# Patient Record
Sex: Male | Born: 1975
Health system: Southern US, Community
[De-identification: ages and names within clinical notes are randomized; demographics above are authoritative.]

## PROBLEM LIST (undated history)

## (undated) DIAGNOSIS — F17200 Nicotine dependence, unspecified, uncomplicated: Secondary | ICD-10-CM

## (undated) DIAGNOSIS — T7840XA Allergy, unspecified, initial encounter: Secondary | ICD-10-CM

## (undated) DIAGNOSIS — E119 Type 2 diabetes mellitus without complications: Secondary | ICD-10-CM

## (undated) DIAGNOSIS — J45909 Unspecified asthma, uncomplicated: Secondary | ICD-10-CM

## (undated) DIAGNOSIS — I509 Heart failure, unspecified: Secondary | ICD-10-CM

## (undated) DIAGNOSIS — IMO0001 Reserved for inherently not codable concepts without codable children: Secondary | ICD-10-CM

## (undated) DIAGNOSIS — E785 Hyperlipidemia, unspecified: Secondary | ICD-10-CM

## (undated) DIAGNOSIS — Z794 Long term (current) use of insulin: Secondary | ICD-10-CM

## (undated) DIAGNOSIS — F319 Bipolar disorder, unspecified: Secondary | ICD-10-CM

## (undated) HISTORY — DX: Bipolar disorder, unspecified: F31.9

## (undated) HISTORY — DX: Long term (current) use of insulin: Z79.4

## (undated) HISTORY — DX: Type 2 diabetes mellitus without complications: E11.9

## (undated) HISTORY — DX: Hyperlipidemia, unspecified: E78.5

## (undated) HISTORY — DX: Unspecified asthma, uncomplicated: J45.909

## (undated) HISTORY — DX: Heart failure, unspecified: I50.9

## (undated) HISTORY — DX: Reserved for inherently not codable concepts without codable children: IMO0001

## (undated) HISTORY — DX: Allergy, unspecified, initial encounter: T78.40XA

## (undated) HISTORY — DX: Nicotine dependence, unspecified, uncomplicated: F17.200

---

## 2008-05-09 ENCOUNTER — Ambulatory Visit: Payer: Self-pay | Admitting: Internal Medicine

## 2008-12-17 ENCOUNTER — Emergency Department (HOSPITAL_COMMUNITY): Admission: EM | Admit: 2008-12-17 | Discharge: 2008-12-17 | Payer: Self-pay | Admitting: Family Medicine

## 2009-01-24 ENCOUNTER — Ambulatory Visit: Payer: Self-pay | Admitting: Internal Medicine

## 2009-01-24 ENCOUNTER — Encounter (INDEPENDENT_AMBULATORY_CARE_PROVIDER_SITE_OTHER): Payer: Self-pay | Admitting: Adult Health

## 2009-01-25 ENCOUNTER — Ambulatory Visit: Payer: Self-pay | Admitting: *Deleted

## 2009-01-31 ENCOUNTER — Ambulatory Visit: Payer: Self-pay | Admitting: Cardiology

## 2009-01-31 ENCOUNTER — Encounter: Payer: Self-pay | Admitting: Internal Medicine

## 2009-01-31 ENCOUNTER — Ambulatory Visit (HOSPITAL_COMMUNITY): Admission: RE | Admit: 2009-01-31 | Discharge: 2009-01-31 | Payer: Self-pay | Admitting: Internal Medicine

## 2009-06-05 ENCOUNTER — Ambulatory Visit: Payer: Self-pay | Admitting: Internal Medicine

## 2009-09-05 ENCOUNTER — Ambulatory Visit: Payer: Self-pay | Admitting: Internal Medicine

## 2009-09-25 ENCOUNTER — Ambulatory Visit: Payer: Self-pay | Admitting: Family Medicine

## 2009-09-25 ENCOUNTER — Encounter (INDEPENDENT_AMBULATORY_CARE_PROVIDER_SITE_OTHER): Payer: Self-pay | Admitting: Adult Health

## 2009-09-25 LAB — CONVERTED CEMR LAB
ALT: 22 units/L (ref 0–53)
AST: 21 units/L (ref 0–37)
BUN: 10 mg/dL (ref 6–23)
Basophils Absolute: 0 10*3/uL (ref 0.0–0.1)
Basophils Relative: 0 % (ref 0–1)
Cholesterol: 186 mg/dL (ref 0–200)
Creatinine, Ser: 0.78 mg/dL (ref 0.40–1.50)
Eosinophils Absolute: 0.2 10*3/uL (ref 0.0–0.7)
Eosinophils Relative: 2 % (ref 0–5)
HDL: 50 mg/dL (ref >39)
MCHC: 31.9 g/dL (ref 30.0–36.0)
MCV: 97.3 fL (ref 78.0–100.0)
Neutrophils Relative %: 61 % (ref 43–77)
Platelets: 351 10*3/uL (ref 150–400)
RDW: 11.8 % (ref 11.5–15.5)
Total Bilirubin: 0.7 mg/dL (ref 0.3–1.2)
Total CHOL/HDL Ratio: 3.7
VLDL: 19 mg/dL (ref 0–40)
WBC: 9.9 10*3/uL (ref 4.0–10.5)

## 2009-11-28 ENCOUNTER — Ambulatory Visit: Payer: Self-pay | Admitting: Internal Medicine

## 2010-01-17 ENCOUNTER — Ambulatory Visit: Payer: Self-pay | Admitting: Internal Medicine

## 2010-02-04 ENCOUNTER — Ambulatory Visit: Payer: Self-pay | Admitting: Internal Medicine

## 2010-02-04 ENCOUNTER — Encounter (INDEPENDENT_AMBULATORY_CARE_PROVIDER_SITE_OTHER): Payer: Self-pay | Admitting: Adult Health

## 2010-02-04 LAB — CONVERTED CEMR LAB
ALT: 19 units/L (ref 0–53)
AST: 24 units/L (ref 0–37)
Albumin: 4.8 g/dL (ref 3.5–5.2)
BUN: 12 mg/dL (ref 6–23)
Calcium: 9.9 mg/dL (ref 8.4–10.5)
Chloride: 101 meq/L (ref 96–112)
HDL: 50 mg/dL (ref >39)
Potassium: 4.8 meq/L (ref 3.5–5.3)
Sodium: 138 meq/L (ref 135–145)

## 2011-03-31 LAB — GLUCOSE, CAPILLARY
Glucose-Capillary: 343 mg/dL — ABNORMAL HIGH (ref 70–99)
Glucose-Capillary: 398 mg/dL — ABNORMAL HIGH (ref 70–99)

## 2015-12-16 DIAGNOSIS — I509 Heart failure, unspecified: Secondary | ICD-10-CM

## 2015-12-16 HISTORY — DX: Heart failure, unspecified: I50.9

## 2015-12-16 HISTORY — PX: COLONOSCOPY: SHX174

## 2018-02-12 HISTORY — PX: NO PAST SURGERIES: SHX2092

## 2018-02-18 DIAGNOSIS — Z760 Encounter for issue of repeat prescription: Secondary | ICD-10-CM | POA: Diagnosis not present

## 2018-02-18 DIAGNOSIS — E109 Type 1 diabetes mellitus without complications: Secondary | ICD-10-CM | POA: Diagnosis not present

## 2018-02-25 ENCOUNTER — Ambulatory Visit: Payer: Self-pay | Admitting: Medical

## 2018-02-26 ENCOUNTER — Ambulatory Visit (INDEPENDENT_AMBULATORY_CARE_PROVIDER_SITE_OTHER): Payer: 59 | Admitting: Medical

## 2018-02-26 ENCOUNTER — Encounter: Payer: Self-pay | Admitting: Medical

## 2018-02-26 VITALS — BP 122/74 | HR 83 | Ht 70.0 in | Wt 164.8 lb

## 2018-02-26 DIAGNOSIS — E785 Hyperlipidemia, unspecified: Secondary | ICD-10-CM

## 2018-02-26 DIAGNOSIS — F17200 Nicotine dependence, unspecified, uncomplicated: Secondary | ICD-10-CM

## 2018-02-26 DIAGNOSIS — E1065 Type 1 diabetes mellitus with hyperglycemia: Secondary | ICD-10-CM | POA: Diagnosis not present

## 2018-02-26 DIAGNOSIS — J4540 Moderate persistent asthma, uncomplicated: Secondary | ICD-10-CM | POA: Insufficient documentation

## 2018-02-26 DIAGNOSIS — J301 Allergic rhinitis due to pollen: Secondary | ICD-10-CM | POA: Diagnosis not present

## 2018-02-26 DIAGNOSIS — F319 Bipolar disorder, unspecified: Secondary | ICD-10-CM

## 2018-02-26 LAB — POCT GLYCOSYLATED HEMOGLOBIN (HGB A1C): Hemoglobin A1C: 6

## 2018-02-26 MED ORDER — ALBUTEROL SULFATE HFA 108 (90 BASE) MCG/ACT IN AERS: 2.0000 | INHALATION_SPRAY | Freq: Four times a day (QID) | RESPIRATORY_TRACT | 1 refills | Status: DC | PRN

## 2018-02-26 MED ORDER — QUETIAPINE FUMARATE 50 MG PO TABS: 50.0000 mg | ORAL_TABLET | Freq: Every day | ORAL | 1 refills | Status: AC

## 2018-02-26 MED ORDER — INSULIN ASPART PROT & ASPART (70-30 MIX) 100 UNIT/ML PEN: PEN_INJECTOR | SUBCUTANEOUS | 2 refills | Status: DC

## 2018-02-26 MED ORDER — FLUTICASONE PROPIONATE 50 MCG/ACT NA SUSP: NASAL | 11 refills | Status: AC

## 2018-02-26 MED ORDER — QUETIAPINE FUMARATE ER 200 MG PO TB24: 200.0000 mg | ORAL_TABLET | Freq: Every day | ORAL | 1 refills | Status: AC

## 2018-02-26 MED ORDER — ROSUVASTATIN CALCIUM 5 MG PO TABS: 5.0000 mg | ORAL_TABLET | Freq: Every day | ORAL | 1 refills | Status: DC

## 2018-02-26 MED ORDER — LISINOPRIL 5 MG PO TABS: 5.0000 mg | ORAL_TABLET | Freq: Every day | ORAL | 3 refills | Status: DC

## 2018-02-26 NOTE — Progress Notes (Signed)
Subjective: Chief Complaint  Patient presents with  . New Patient (Initial Visit)    new pt , dm , new refills    Here as a new patient.  Fasting today for labs.   Referred by Rubbie BattiestKeavie Chapman.  Works at [employer].  Type 1 Diabetes - taking Novolog 70/30, 55u in morning, 45u night.  Checking glucose regularly and seeing high numbers.   Been on insulin since age 42yo.  Interested in a pump.   Hyperlipidemia - has had stomach bloating with Lovastatin, but tolerates Crestor 5mg .   Hasn't been on cholsteerol medication since December due to changes in job, finances, and rationing out medication.     Takes Lisinopril 5mg  daily to protect the kidney.  No hx/o HTN.  Takes Seroquel XR 200mg  daily at bedtime, and Seroquel 50mg  daily in morning, but been out of this for a month.    Has hx/o Bipolar.  Was getting this through his prior PCP.  [place] seen psychiatrist about a year ago in [place], and prior [place] psychiatrist in [place], [place].  Asthma and allergies - uses flonase, takes Albuterol 2 puffs QHS.  Smoker.    Has hx/o CHF 2017.   Doing fine since.  Not currently seeing cardiology.   Past Medical History:  Diagnosis Date  . Allergy   . Asthma   . Bipolar depression (HCC)   . CHF (congestive heart failure) (HCC) 2017  . Hyperlipidemia   . IDDM (insulin dependent diabetes mellitus) (HCC)    since age 42yo  . Smoker     No current outpatient medications on file prior to visit.   No current facility-administered medications on file prior to visit.     ROS as in subjective   Objective: BP 122/74   Pulse 83   Ht 5\' 10"  (1.778 m)   Wt 164 lb 12.8 oz (74.8 kg)   SpO2 95%   BMI 23.65 kg/m   General appearance: alert, no distress, WD/WN, lean AA male Neck: supple, no lymphadenopathy, no thyromegaly, no masses, no bruits Heart: RRR, normal S1, S2, no murmurs Lungs: CTA bilaterally, no wheezes, rhonchi, or rales Pulses: 2+ symmetric, upper and lower extremities, normal cap  refill Ext: no edema  Diabetic Foot Exam - Simple   Simple Foot Form Diabetic Foot exam was performed with the following findings:  Yes 02/26/2018  9:28 AM  Visual Inspection See comments:  Yes Sensation Testing Intact to touch and monofilament testing bilaterally:  Yes Pulse Check Posterior Tibialis and Dorsalis pulse intact bilaterally:  Yes Comments Right 5th toe small callous lateral DIP      Assessment: Encounter Diagnoses  Name Primary?  Marland Kitchen. Uncontrolled type 1 diabetes mellitus with hyperglycemia (HCC) Yes  . Hyperlipidemia, unspecified hyperlipidemia type   . Smoker   . Moderate persistent asthma, unspecified whether complicated   . Allergic rhinitis due to pollen, unspecified seasonality   . Bipolar affective disorder, remission status unspecified (HCC)      Plan: We discussed his medical history, his medications, and it sounds like he is usually compliant and good about monitoring his sugars and health.  He has been out of medicine lately and rationing his medicine.  His hemoglobin A1c was 6.0% today however his home readings had several postprandial readings 400-600s.  Thus I am not so sure our numbers are trustworthy today.  Given his interest in the insulin pump we will refer to endocrinology  I refilled all of his medications today.  We discussed  routine diabetic care including daily foot checks, yearly eye doctor visit, regular follow-up here.  Recommend he quit smoking  Next visit consider preventative inhaler, update PFTs  Next visit consider EKG.  Nathan Mccullough was seen today for new patient (initial visit).  Diagnoses and all orders for this visit:  Uncontrolled type 1 diabetes mellitus with hyperglycemia (HCC) -     HgB A1c -     CBC with Differential/Platelet -     TSH -     HM DIABETES EYE EXAM -     HM DIABETES FOOT EXAM -     Microalbumin / creatinine urine ratio -     Ambulatory referral to Endocrinology  Hyperlipidemia, unspecified hyperlipidemia  type -     Comprehensive metabolic panel -     CBC with Differential/Platelet -     Lipid panel  Smoker  Moderate persistent asthma, unspecified whether complicated -     CBC with Differential/Platelet  Allergic rhinitis due to pollen, unspecified seasonality  Bipolar affective disorder, remission status unspecified (HCC)  Other orders -     albuterol (PROVENTIL HFA;VENTOLIN HFA) 108 (90 Base) MCG/ACT inhaler; Inhale 2 puffs into the lungs every 6 (six) hours as needed for wheezing or shortness of breath. -     fluticasone (FLONASE) 50 MCG/ACT nasal spray; 1 spray by Both Nostrils route daily. -     lisinopril (PRINIVIL,ZESTRIL) 5 MG tablet; Take 1 tablet (5 mg total) by mouth daily. -     QUEtiapine (SEROQUEL XR) 200 MG 24 hr tablet; Take 1 tablet (200 mg total) by mouth at bedtime. -     QUEtiapine (SEROQUEL) 50 MG tablet; Take 1 tablet (50 mg total) by mouth daily. -     rosuvastatin (CRESTOR) 5 MG tablet; Take 1 tablet (5 mg total) by mouth at bedtime. -     insulin aspart protamine - aspart (NOVOLOG 70/30 MIX) (70-30) 100 UNIT/ML FlexPen; Inject 55 units into skin every morning and 45 units every evening

## 2018-02-27 LAB — CBC WITH DIFFERENTIAL/PLATELET
BASOS ABS: 0 10*3/uL (ref 0.0–0.2)
Basos: 0 %
EOS (ABSOLUTE): 0.3 10*3/uL (ref 0.0–0.4)
Eos: 2 %
Hematocrit: 44.3 % (ref 37.5–51.0)
Hemoglobin: 14.6 g/dL (ref 13.0–17.7)
IMMATURE GRANS (ABS): 0 10*3/uL (ref 0.0–0.1)
IMMATURE GRANULOCYTES: 0 %
LYMPHS: 19 %
Lymphocytes Absolute: 2.7 10*3/uL (ref 0.7–3.1)
MCH: 31.9 pg (ref 26.6–33.0)
MCHC: 33 g/dL (ref 31.5–35.7)
MCV: 97 fL (ref 79–97)
MONOCYTES: 5 %
Monocytes Absolute: 0.7 10*3/uL (ref 0.1–0.9)
Neutrophils Absolute: 10.5 10*3/uL — ABNORMAL HIGH (ref 1.4–7.0)
Neutrophils: 74 %
PLATELETS: 390 10*3/uL — AB (ref 150–379)
RBC: 4.58 x10E6/uL (ref 4.14–5.80)
RDW: 12.1 % — ABNORMAL LOW (ref 12.3–15.4)
WBC: 14.2 10*3/uL — ABNORMAL HIGH (ref 3.4–10.8)

## 2018-02-27 LAB — COMPREHENSIVE METABOLIC PANEL
A/G RATIO: 1.6 (ref 1.2–2.2)
ALT: 19 IU/L (ref 0–44)
AST: 19 IU/L (ref 0–40)
Albumin: 4.5 g/dL (ref 3.5–5.5)
Alkaline Phosphatase: 86 IU/L (ref 39–117)
BILIRUBIN TOTAL: 0.7 mg/dL (ref 0.0–1.2)
BUN/Creatinine Ratio: 16 (ref 9–20)
BUN: 16 mg/dL (ref 6–24)
CALCIUM: 9.5 mg/dL (ref 8.7–10.2)
CHLORIDE: 100 mmol/L (ref 96–106)
CO2: 19 mmol/L — ABNORMAL LOW (ref 20–29)
Creatinine, Ser: 1.02 mg/dL (ref 0.76–1.27)
GFR calc Af Amer: 106 mL/min/{1.73_m2} (ref >59)
GFR, EST NON AFRICAN AMERICAN: 92 mL/min/{1.73_m2} (ref >59)
GLUCOSE: 328 mg/dL — AB (ref 65–99)
Globulin, Total: 2.9 g/dL (ref 1.5–4.5)
POTASSIUM: 4.6 mmol/L (ref 3.5–5.2)
Sodium: 136 mmol/L (ref 134–144)
Total Protein: 7.4 g/dL (ref 6.0–8.5)

## 2018-02-27 LAB — LIPID PANEL
CHOLESTEROL TOTAL: 186 mg/dL (ref 100–199)
Chol/HDL Ratio: 4 ratio (ref 0.0–5.0)
HDL: 46 mg/dL (ref >39)
LDL Calculated: 106 mg/dL — ABNORMAL HIGH (ref 0–99)
Triglycerides: 171 mg/dL — ABNORMAL HIGH (ref 0–149)
VLDL CHOLESTEROL CAL: 34 mg/dL (ref 5–40)

## 2018-02-27 LAB — MICROALBUMIN / CREATININE URINE RATIO
CREATININE, UR: 112.9 mg/dL
MICROALBUM., U, RANDOM: 6.3 ug/mL
Microalb/Creat Ratio: 5.6 mg/g creat (ref 0.0–30.0)

## 2018-02-27 LAB — TSH: TSH: 1.43 u[IU]/mL (ref 0.450–4.500)

## 2018-03-01 ENCOUNTER — Telehealth: Payer: Self-pay

## 2018-03-01 NOTE — Telephone Encounter (Signed)
Vernona RiegerLaura, can you write letter on behalf of him for insurance coverage for 70/30 insulin.  He notes prior problems with Novolog or Humolog R .  He apparently has only tolerated 70/30 insulin.   He notes insurance is denying this.   He is a type 1 diabetic.   Thanks, Vincenza HewsShane

## 2018-03-01 NOTE — Telephone Encounter (Signed)
Patient called and left voicemail on nurse line, stating he would like to know if referral to endocrinology was sent in. I advised patient that it was sent in.  Patient also advised me that his insulin 70/30 was not covered by his insurance. He stated that this is the only insulin that works for him as he has allergy to all other types. Patient states that if we send a letter to them advising that he needs this type of insulin they will cover. He will be out of insulin 03/14/18 and can not afford the cost and will then be out of insulin.  Please advise if doing this letter would be okay?   Thank you!

## 2018-03-02 NOTE — Telephone Encounter (Signed)
Called patient, given lab results. Patient made appointment for follow up fasting in 3 months. Referral for endocrinology was sent in.

## 2018-03-02 NOTE — Telephone Encounter (Signed)
-----   Message from Jac Canavanavid S Tysinger, PA-C sent at 03/01/2018  9:36 PM EDT ----- Glucose elevated, rest of labs ok.   Make sure we referred to endocrinology.  C/t same medication and follow up with fasting recheck in 3 months

## 2018-03-02 NOTE — Progress Notes (Signed)
Called patient and given lab results, made appointment in 3 months for fasting recheck. No questions or concerns.

## 2018-03-02 NOTE — Progress Notes (Signed)
Called and notified patient of results. Patient understood and made follow up appointment. No questions or concerns.

## 2018-03-04 ENCOUNTER — Telehealth: Payer: Self-pay | Admitting: Medical

## 2018-03-04 NOTE — Telephone Encounter (Signed)
Called pharmacy & Novolog went thru but cost is $1061 for 30 days, no P.A. needed and that is with discount card which took off $200. I called pt for drug history and he states Humulin R increased his blood sugar & Humalog he couldn't take because he was allergic to it. I called [facility] t#  [phone] [id number] no P.A. Needed, it is covered just a higher Tier, so I completed Tiering exception over the phone, P.A. #[id number]

## 2018-03-08 NOTE — Telephone Encounter (Signed)
Recv'd fax that tiering exception request cancelled.  I called [facility] t# [phone] was informed because this is plan exclusion can't do a tiering exception.  Spoke with member services and preferred medication is Humalog and pt is allergic to Humalog so only option is to file P.A. For medical necessity and if denied can do an appeal then may can get a Tier 3 structure approval.  Said to do an Urgent request.  Then if can get approved can then do a Tier/co pay reduction request.  Unable to complete P.A. Thru cover my meds because kept getting rejection stating medication doesn't require P.A. So I completed P.A. Over the phone P.A. # [phone], will receive response within 24 hours.  Tried to call pt unable to leave message

## 2018-03-09 ENCOUNTER — Telehealth: Payer: Self-pay | Admitting: Medical

## 2018-03-09 ENCOUNTER — Encounter: Payer: Self-pay | Admitting: Medical

## 2018-03-09 NOTE — Telephone Encounter (Signed)
Recv'd fax back from [facility] stating Novolog on plans covered list of meds and no P.A. Is required.  Called [facility] T# [phone] and was told today that it is covered Tier 6 non formulary and didn't know why I had been told that was plan exclusion so I can't do a P.A. And get a denial for an appeal.  So said no other alternatives to help pt.  She didn't know why it was so expensive and only suggestion she had was to call member services to see if pt has a high deductible.  I called member services t# [phone] and they said Novolog is a plan exclusion, I explained this is ridiculous that I have been told multiple times wrong information and she apologized.  She gave me information to appeals department fax# [phone] ATTN: Appeal coordinator

## 2018-03-09 NOTE — Telephone Encounter (Signed)
[employer] Pt Assistance T#[phone] & they are not able to help pt because he is insured, they only assist with uninsured and Medicare and they do not have emergency or temporary programs available.

## 2018-03-09 NOTE — Telephone Encounter (Signed)
Appeal letter typed and faxed  

## 2018-03-10 ENCOUNTER — Other Ambulatory Visit: Payer: Self-pay

## 2018-03-10 ENCOUNTER — Telehealth: Payer: Self-pay | Admitting: Medical

## 2018-03-10 DIAGNOSIS — E1065 Type 1 diabetes mellitus with hyperglycemia: Secondary | ICD-10-CM

## 2018-03-10 MED ORDER — INSULIN ISOPHANE & REGULAR (HUMAN 70-30)100 UNIT/ML KWIKPEN: PEN_INJECTOR | SUBCUTANEOUS | 2 refills | Status: DC

## 2018-03-10 NOTE — Telephone Encounter (Signed)
Sent over referral information.

## 2018-03-10 NOTE — Telephone Encounter (Signed)
Discussed with Vincenza HewsShane and will change to Relion [facility] brand 70/30 same dosage.  Called [facility] pharmacist at t# [phone] and they do have Relion flexpens 5 pens in box for $42 same dosage as Novolog and for 100ml qd will need 2 boxes for 30 days so will cost pt $82 if insurance will not pay anything.

## 2018-03-10 NOTE — Telephone Encounter (Signed)
Rep Nathan DikeJennifer said Dr. Turner Mccullough'Nathan Mccullough Endocrinologist at [facility] in [place] is accepting new patients and should be able to get pt in quicker T#[phone].  Called Nathan Mccullough new patient coordinator and she request referral with note stating urgent and she would try to get pt in as soon as possible.   Nathan FanningJulie per Nathan HewsShane please send in referral with note requesting urgent appointment. Thanks

## 2018-03-15 ENCOUNTER — Ambulatory Visit: Payer: Self-pay | Admitting: Medical

## 2018-03-15 ENCOUNTER — Telehealth: Payer: Self-pay | Admitting: Medical

## 2018-03-15 NOTE — Telephone Encounter (Signed)
Pt states he started new insulin Friday morning and it's not bringing his sugar levels down, sugars running over 600, stomach hurting so bad, can't focus.  Doesn't feel good.  Mouth dry, just checked sugar 565.  Only thing he has had was 1/2 vitamin water today total 27 grams in bottle, no food today.

## 2018-03-16 ENCOUNTER — Telehealth: Payer: Self-pay

## 2018-03-16 NOTE — Telephone Encounter (Signed)
Called patient to advise on how he was feeling. He is feeling much better today, his blood sugar this morning was 188. Patient states he tried to call yesterday for his appointment to cancel but was on hold for 10 minutes and had to hang up due to being at work. Patient did reschedule his appointment for next Thursday since that is his day off work to discuss the changes in medication.

## 2018-03-25 ENCOUNTER — Ambulatory Visit: Payer: Self-pay | Admitting: Medical

## 2018-03-25 DIAGNOSIS — E10649 Type 1 diabetes mellitus with hypoglycemia without coma: Secondary | ICD-10-CM | POA: Insufficient documentation

## 2018-03-25 DIAGNOSIS — I152 Hypertension secondary to endocrine disorders: Secondary | ICD-10-CM | POA: Insufficient documentation

## 2018-03-25 DIAGNOSIS — E1069 Type 1 diabetes mellitus with other specified complication: Secondary | ICD-10-CM | POA: Diagnosis not present

## 2018-03-25 DIAGNOSIS — E1159 Type 2 diabetes mellitus with other circulatory complications: Secondary | ICD-10-CM | POA: Insufficient documentation

## 2018-03-25 DIAGNOSIS — E785 Hyperlipidemia, unspecified: Secondary | ICD-10-CM

## 2018-03-25 DIAGNOSIS — I10 Essential (primary) hypertension: Secondary | ICD-10-CM

## 2018-04-13 ENCOUNTER — Encounter: Payer: 59 | Attending: "Endocrinology | Admitting: Dietician

## 2018-04-13 ENCOUNTER — Encounter: Payer: Self-pay | Admitting: Dietician

## 2018-04-13 VITALS — BP 108/66 | Ht 71.0 in | Wt 171.8 lb

## 2018-04-13 DIAGNOSIS — E10649 Type 1 diabetes mellitus with hypoglycemia without coma: Secondary | ICD-10-CM | POA: Diagnosis present

## 2018-04-13 DIAGNOSIS — Z713 Dietary counseling and surveillance: Secondary | ICD-10-CM | POA: Insufficient documentation

## 2018-04-13 DIAGNOSIS — E109 Type 1 diabetes mellitus without complications: Secondary | ICD-10-CM

## 2018-04-13 NOTE — Patient Instructions (Addendum)
  Check blood sugars 4 x day before each meal and before bed every day and some 2 hr after meals and record  Bring blood sugar records to the next appointment/class  Exercise:  Continue walking at least 1 hour every day  Eat 3 meals day  2-3 snacks a day in afternoon and at bedtime; am snack-optional  Space meals 4-5 hours apart  Eat 3-4 carbohydrate serving/meal + protein  Eat 1 carbohydrate serving/snack + protein  Avoid sugar sweetened drinks (soda, tea, coffee, sports drinks, juices)  Limit intake of sweets and fried foods  Complete 3 Day Food Record and bring to next appt-estimate carbohydrate grams   Quit / Decrease smoking  Make an eye doctor appointment  Carry fast acting glucose and a snack at all times  Wear medical alert ID at all times  Rotate injection sites  Return for appointment/classes on:  04-22-18

## 2018-04-13 NOTE — Progress Notes (Signed)
Diabetes Self-Management Education  Visit Type: First/Initial  Appt. Start Time: 1100 Appt. End Time: 1215  04/13/2018  [name], identified by name and date of birth, is a 42 y.o. male with a diagnosis of Diabetes: Type 1.   ASSESSMENT  Blood pressure 108/66, height  (1.803 m), weight 171 lb 12.8 oz (77.9 kg). Body mass index is 23.96 kg/m.  Diabetes Self-Management Education - 04/13/18 1308      Visit Information   Visit Type  First/Initial      Initial Visit   Diabetes Type  Type 1      Health Coping   How would you rate your overall health?  Excellent      Psychosocial Assessment   Patient Belief/Attitude about Diabetes  Motivated to manage diabetes    Self-care barriers  None    Self-management support  Doctor's office girlfriend    Other persons present  Patient    Patient Concerns  Glycemic Control;Healthy Lifestyle become more fit    Special Needs  None    Preferred Learning Style  Visual;Auditory;Hands on    Learning Readiness  Ready      Pre-Education Assessment   Patient understands the diabetes disease and treatment process.  Needs Review    Patient understands incorporating nutritional management into lifestyle.  Needs Review    Patient undertands incorporating physical activity into lifestyle.  Needs Review    Patient understands using medications safely.  Needs Review    Patient understands monitoring blood glucose, interpreting and using results  Needs Review    Patient understands prevention, detection, and treatment of acute complications.  Needs Review    Patient understands prevention, detection, and treatment of chronic complications.  Needs Review    Patient understands how to develop strategies to address psychosocial issues.  Needs Review    Patient understands how to develop strategies to promote health/change behavior.  Needs Review      Complications   Last HgB A1C per patient/outside source  6 % 02-26-18    Fasting Blood glucose  range (mg/dL)  <16;10-960;454-098    Postprandial Blood glucose range (mg/dL)  <11;91-478;295-621;308-657;>846    Have you had a dilated eye exam in the past 12 months?  No 02-2017    Have you had a dental exam in the past 12 months?  Yes 11-2017    Are you checking your feet?  Yes    How many days per week are you checking your feet?  7      Dietary Intake   Breakfast  eats breakfats at 8a=usually eats special K with milk or sausage/cheese croissant    Snack (morning)  eats crackers with cheese or peanut butter at 10-11 a    Lunch  eats lunch at 12-2p=usually eats at [employer] 6x/wk (chkn sandwich fried or grilled)-eats fried foods 4-5x/wk; eats sweets 4-5x/day to treat low BG's    Snack (afternoon)  eats crackers with peanut butter or cheese at [employer]  eats supper at 7-8p (works late)=salads or meat with vegetables or spaghetti    Snack (evening)  eats crackers with peanut butter or cheese at 9-10p and 11p-12a    Beverage(s)  drinks fruit juices and sweet beverages throughout day to treat low BG's; drinks water 8+x/day, milk x1/day      Exercise   Exercise Type  Light (walking / raking leaves) walks 10 miles/day at work 6 days/wk    How many days per week to you exercise?  6      Patient Education   Previous Diabetes Education  Yes (please comment)    Disease state   Definition of diabetes, type 1 and 2, and the diagnosis of diabetes    Nutrition management   Role of diet in the treatment of diabetes and the relationship between the three main macronutrients and blood glucose level;Food label reading, portion sizes and measuring food.;Carbohydrate counting reviewed basic carbohydrate counting using food models-recommend to eat 3-4 carb servings/meal + protein and 1 carbohydrate serving/snack + protein   Physical activity and exercise   Role of exercise on diabetes management, blood pressure control and cardiac health.    Medications  Taught/reviewed insulin injection, site  rotation, insulin storage and needle disposal.;Reviewed patients medication for diabetes, action, purpose, timing of dose and side effects. reviewed use of Novolin 70/30 pen and Novolog 70/30 insulin (pt currently using Novolin 70/30 but plans to switch to Novolog 70/30 soon when insurance approves)discussed likelihood that insulin needs to be reduced and use of basal/bolus insulin option    Monitoring  Taught/discussed recording of test results and interpretation of SMBG.;Identified appropriate SMBG and/or A1C goals.;Yearly dilated eye exam;Purpose and frequency of SMBG. pt using Libre and scans frequently during day and evening    Acute complications  Taught treatment of hypoglycemia - the 15 rule.;Discussed and identified patients' treatment of hyperglycemia. gave pt 1 tube of glucose tablets to carry for PRN use; pt wears diabetes ID bracelet-gave pt a medical alert ID card for wallet    Chronic complications  Relationship between chronic complications and blood glucose control;Retinopathy and reason for yearly dilated eye exams;Dental care;Nephropathy, what it is, prevention of, the use of ACE, ARB's and early detection of through urine microalbumia.;Lipid levels, blood glucose control and heart disease    Personal strategies to promote health  Lifestyle issues that need to be addressed for better diabetes care;Helped patient develop diabetes management plan for (enter comment)      Outcomes   Expected Outcomes  Demonstrated interest in learning. Expect positive outcomes       Individualized Plan for Diabetes Self-Management Training:   Learning Objective:  Patient will have a greater understanding of diabetes self-management. Patient education plan is to attend individual and/or group sessions per assessed needs and concerns.   Plan:   Patient Instructions   Check blood sugars 4 x day before each meal and before bed every day and some 2 hr after meals and record  Bring blood sugar records  to the next appointment/class  Exercise:  Continue walking at least 1 hour every day  Eat 3 meals day  2-3 snacks a day in afternoon and at bedtime; am snack-optional  Space meals 4-5 hours apart  Eat 3-4 carbohydrate serving/meal + protein  Eat 1 carbohydrate serving/snack + protein  Avoid sugar sweetened drinks (soda, tea, coffee, sports drinks, juices)  Limit intake of sweets and fried foods  Make healthy food choices  Complete 3 Day Food Record and bring to next appt-estimate carbohydrate grams   Quit / Decrease smoking  Make an eye doctor appointment  Carry fast acting glucose and a snack at all times  Wear medical alert ID at all times  Rotate injection sites  Return for appointment/classes on:  04-22-18   Expected Outcomes:  Demonstrated interest in learning. Expect positive outcomes  Education material provided: Living Well With Diabetes booklet, low and high BG handout, A1C handout, medical alert ID card, 1 tube glucose tablets for PRN use  If problems or questions, patient to contact team via: [phone]  Future DSME appointment:  04-22-18 with RD

## 2018-04-15 ENCOUNTER — Ambulatory Visit: Payer: Self-pay | Admitting: Medical

## 2018-04-22 ENCOUNTER — Telehealth: Payer: Self-pay | Admitting: Medical

## 2018-04-22 ENCOUNTER — Ambulatory Visit: Payer: 59 | Admitting: Dietician

## 2018-04-22 ENCOUNTER — Encounter: Payer: Self-pay | Admitting: Dietician

## 2018-04-22 ENCOUNTER — Encounter: Payer: Self-pay | Admitting: Medical

## 2018-04-22 ENCOUNTER — Ambulatory Visit (INDEPENDENT_AMBULATORY_CARE_PROVIDER_SITE_OTHER): Payer: 59 | Admitting: Medical

## 2018-04-22 VITALS — BP 102/60 | HR 84 | Temp 98.1°F | Ht 70.0 in | Wt 170.6 lb

## 2018-04-22 DIAGNOSIS — J4540 Moderate persistent asthma, uncomplicated: Secondary | ICD-10-CM | POA: Diagnosis not present

## 2018-04-22 DIAGNOSIS — E1065 Type 1 diabetes mellitus with hyperglycemia: Secondary | ICD-10-CM | POA: Diagnosis not present

## 2018-04-22 DIAGNOSIS — R9431 Abnormal electrocardiogram [ECG] [EKG]: Secondary | ICD-10-CM | POA: Diagnosis not present

## 2018-04-22 DIAGNOSIS — F17200 Nicotine dependence, unspecified, uncomplicated: Secondary | ICD-10-CM | POA: Diagnosis not present

## 2018-04-22 DIAGNOSIS — R931 Abnormal findings on diagnostic imaging of heart and coronary circulation: Secondary | ICD-10-CM | POA: Insufficient documentation

## 2018-04-22 DIAGNOSIS — E785 Hyperlipidemia, unspecified: Secondary | ICD-10-CM | POA: Diagnosis not present

## 2018-04-22 DIAGNOSIS — Z7189 Other specified counseling: Secondary | ICD-10-CM | POA: Diagnosis not present

## 2018-04-22 DIAGNOSIS — J301 Allergic rhinitis due to pollen: Secondary | ICD-10-CM

## 2018-04-22 DIAGNOSIS — Z7185 Encounter for immunization safety counseling: Secondary | ICD-10-CM | POA: Insufficient documentation

## 2018-04-22 MED ORDER — ROSUVASTATIN CALCIUM 10 MG PO TABS: 10.0000 mg | ORAL_TABLET | Freq: Every day | ORAL | 1 refills | Status: DC
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
## 2018-04-22 NOTE — Patient Instructions (Addendum)
Recommendations:  Lets have you cut the Lisinopril  in half and take 1/2 tablet daily for kidney protection.  This is mainly due to your blood pressures running a little bit on the low side.  I want to increase Crestor.   When you run out of Crestor , change to the  daily.  Your cholesterol can be checked fasting at your next visit either with endocrinology or with me  Continue the other medications as usual  Follow up with Endocrinology as planned  Check insurance coverage and deductible because I would like you to have an echocardiogram/ultrasound of the heart  I recommend you work to quit smoking.  Call 1-800-QUIT-NOW to help get some tips on quitting.  I can help you with medication to stop smoking.

## 2018-04-22 NOTE — Patient Outreach (Signed)
Patient cancelled his appointment today due to time conflict. He rescheduled for 05/06/18.

## 2018-04-22 NOTE — Telephone Encounter (Signed)
Please tentatively refer to cardiology.   We are going to try and get a copy of his prior EKG to compare with today's EKG, but I'd like to get him on the schedule in the next month or so for eval.   Schedule him for 4-5 weeks out.    FYI - if his prior EKG is unchanged from today's EKG, then we may just do an Echo.

## 2018-04-27 NOTE — Telephone Encounter (Signed)
Pt has been referred to cardiology

## 2018-04-29 ENCOUNTER — Telehealth: Payer: Self-pay | Admitting: Medical

## 2018-04-29 NOTE — Telephone Encounter (Signed)
I received prior records including EKG.   After reviewing chart records, lets refer to cardiology for updated evaluation.  Send copies of our EKG and the one from 2014 along with my last notes and labs.

## 2018-05-05 ENCOUNTER — Other Ambulatory Visit: Payer: Self-pay | Admitting: Medical

## 2018-05-05 ENCOUNTER — Telehealth: Payer: Self-pay

## 2018-05-05 NOTE — Telephone Encounter (Signed)
Spoke to pt. Medication was already sent to pharmacy. Patient was informing is that his pharmacy was out of stock so they are trying to send it to a different pharmacy.

## 2018-05-05 NOTE — Telephone Encounter (Signed)
Patient called states that [facility] says they do not have the Novolin 70/30 flexpen refills can we please call in refills to [facility] on [address].  Please contact patient when it has been sent in [phone].

## 2018-05-06 ENCOUNTER — Encounter: Payer: 59 | Attending: "Endocrinology | Admitting: Dietician

## 2018-05-06 ENCOUNTER — Encounter: Payer: Self-pay | Admitting: Dietician

## 2018-05-06 VITALS — Ht 71.0 in | Wt 172.1 lb

## 2018-05-06 DIAGNOSIS — Z713 Dietary counseling and surveillance: Secondary | ICD-10-CM | POA: Diagnosis not present

## 2018-05-06 DIAGNOSIS — E10649 Type 1 diabetes mellitus with hypoglycemia without coma: Secondary | ICD-10-CM | POA: Diagnosis not present

## 2018-05-06 DIAGNOSIS — E109 Type 1 diabetes mellitus without complications: Secondary | ICD-10-CM

## 2018-05-06 NOTE — Patient Instructions (Signed)
   Use Calorieking.com or other food tracking website or app to look up carb grams in foods.   If a nutrition facts label is available use that to determine carb grams. Make sure you are estimating your portion accurately.   Aim for consistent carb intake each meal and each day. Goal is 60-75grams of carbs with each meal.   Always include a protein source with meals to help the meal last longer and reduce the risk of low blood sugar.

## 2018-05-06 NOTE — Progress Notes (Signed)
Diabetes Self-Management Education  Visit Type:  Comprehensive  Appt. Start Time: 1530 Appt. End Time: 1630  05/06/2018  [name], identified by name and date of birth, is a 42 y.o. male with a diagnosis of Diabetes:  .   ASSESSMENT  Height  (1.803 m), weight 172 lb 1.6 oz (78.1 kg). Body mass index is 24 kg/m.   Diabetes Self-Management Education - 05/06/18 1640      Complications   How often do you check your blood sugar?  > 4 times/day    Fasting Blood glucose range (mg/dL)  <16;10-960;454-098    Postprandial Blood glucose range (mg/dL)  <11;91-478;295-621;308-657;>846    Number of hypoglycemic episodes per month  5 multiple episodes    Can you tell when your blood sugar is low?  No      Dietary Intake   Breakfast  3 meals and 1-2 snacks daily, occasional skipped meals due to high BGs      Exercise   Exercise Type  Light (walking / raking leaves)    How many days per week to you exercise?  6    How many minutes per day do you exercise?  180    Total minutes per week of exercise  1080      Patient Education   Nutrition management   Role of diet in the treatment of diabetes and the relationship between the three main macronutrients and blood glucose level;Food label reading, portion sizes and measuring food.;Other (comment) carb counting    Physical activity and exercise   Role of exercise on diabetes management, blood pressure control and cardiac health.       Learning Objective:  Patient will have a greater understanding of diabetes self-management. Patient education plan is to attend individual and/or group sessions per assessed needs and concerns.  Instructed patient on carbohydrate counting, including reference resources, food portions and importance of accurate estimates/ measurements. Patient's food diary indicates some underestimating of carb amounts, and some unnecessary counts such as for protein foods. He plans to continue keeping a food diary and  working on carb counting accuracy.  Estimated patient's energy needs at about 2000-2500kcal daily with 60-75grams carb with each meal.    Plan:   Patient Instructions   Use Calorieking.com or other food tracking website or app to look up carb grams in foods.   If a nutrition facts label is available use that to determine carb grams. Make sure you are estimating your portion accurately.   Aim for consistent carb intake each meal and each day. Goal is 60-75grams of carbs with each meal.   Always include a protein source with meals to help the meal last longer and reduce the risk of low blood sugar.     Expected Outcomes:   good  Education material provided: carbohydrate counting reference booklet (Apidra)  If problems or questions, patient to contact team via:  Phone and Email  Future DSME appointment: - 4-6 wks

## 2018-05-07 ENCOUNTER — Encounter: Payer: Self-pay | Admitting: Medical

## 2018-05-12 ENCOUNTER — Encounter: Payer: Self-pay | Admitting: Medical

## 2018-05-12 NOTE — Telephone Encounter (Signed)
No not both.   Stick with wherever we referred

## 2018-05-12 NOTE — Telephone Encounter (Signed)
Pt was referred to [facility] for cardiology. Did you want him to be referred to Dr. Jacinto Halim?

## 2018-05-12 NOTE — Telephone Encounter (Signed)
Has this been done?

## 2018-05-12 NOTE — Telephone Encounter (Signed)
Noted  

## 2018-05-18 ENCOUNTER — Encounter: Payer: Self-pay | Admitting: Dietician

## 2018-05-18 NOTE — Progress Notes (Signed)
Called pt to reschedule FU appt as provider is unavailable on 05-25-18; rescheduled FU appt on 05-31-18

## 2018-05-25 ENCOUNTER — Ambulatory Visit: Payer: 59 | Admitting: Dietician

## 2018-05-27 ENCOUNTER — Ambulatory Visit: Payer: 59 | Admitting: Cardiology

## 2018-05-31 ENCOUNTER — Ambulatory Visit: Payer: 59 | Admitting: Dietician

## 2018-06-02 ENCOUNTER — Ambulatory Visit: Payer: Self-pay | Admitting: Medical

## 2018-06-08 ENCOUNTER — Encounter: Payer: Self-pay | Admitting: Dietician

## 2018-06-08 ENCOUNTER — Encounter: Payer: 59 | Attending: "Endocrinology | Admitting: Dietician

## 2018-06-08 VITALS — BP 110/64 | Wt 171.4 lb

## 2018-06-08 DIAGNOSIS — E109 Type 1 diabetes mellitus without complications: Secondary | ICD-10-CM

## 2018-06-08 DIAGNOSIS — E10649 Type 1 diabetes mellitus with hypoglycemia without coma: Secondary | ICD-10-CM | POA: Insufficient documentation

## 2018-06-08 DIAGNOSIS — Z713 Dietary counseling and surveillance: Secondary | ICD-10-CM | POA: Insufficient documentation

## 2018-06-08 NOTE — Progress Notes (Signed)
Diabetes Self-Management Education  Visit Type: Follow-up  Appt. Start Time: 1330 Appt. End Time: 1430  06/08/2018  [name], identified by name and date of birth, is a 42 y.o. male with a diagnosis of Diabetes: Type 1.   ASSESSMENT  Blood pressure 110/64, weight 171 lb 6.4 oz (77.7 kg). Body mass index is 23.91 kg/m.  Diabetes Self-Management Education - 06/08/18 1558      Visit Information   Visit Type  Follow-up      Initial Visit   Diabetes Type  Type 1      Complications   How often do you check your blood sugar?  -- scans Libre 5-6x/day-pt reports having frequent low BG's in afternoon and advised pt to discuss with MD about reducing AM dose of Novolin 70/30 to help with this-pt was having frequent  low BG's after supper and during night but they have decreased since he reduced his Novolin 70/30 to 35 units BID   Fasting Blood glucose range (mg/dL)  <69;>629;528-413;244-010;27-253 per Josephine Igo results vary LO-400's   Postprandial Blood glucose range (mg/dL)  66-440;347-425;956-387;>564;<33 per Josephine Igo results vary LO-400's   Have you had a dilated eye exam in the past 12 months?  Yes last eye exam was 11-2017    Have you had a dental exam in the past 12 months?  Yes 11-2017    Are you checking your feet?  Yes    How many days per week are you checking your feet?  7      Dietary Intake   Breakfast  eats 3 meals/day and 2 snacks (eats <60 grams carbs for each meal and <25 grams carbs per snack)      Exercise   Exercise Type  Light (walking / raking leaves)    How many days per week to you exercise?  7    How many minutes per day do you exercise?  -- walks 8-10 miles/day at work      Patient Education   Physical activity and exercise   Role of exercise on diabetes management, blood pressure control and cardiac health.;Helped patient identify appropriate exercises in relation to his/her diabetes, diabetes complications and other health issue.    Monitoring  Yearly dilated  eye exam;Daily foot exams;Ketone testing, when, how.;Interpreting lab values - A1C, lipid, urine microalbumina.   Discussed use of ICR for meal dosing and ISF for correcting high BG's    Acute complications  Taught treatment of hypoglycemia - the 15 rule.;Discussed and identified patients' treatment of hyperglycemia.;Covered sick day management with medication and food.;Trained/discussed glucagon administration to patient and designated other.    Chronic complications  Relationship between chronic complications and blood glucose control;Lipid levels, blood glucose control and heart disease;Reviewed with patient heart disease, higher risk of, and prevention;Retinopathy and reason for yearly dilated eye exams;Assessed and discussed foot care and prevention of foot problems;Dental care;Nephropathy, what it is, prevention of, the use of ACE, ARB's and early detection of through urine microalbumia.;Applicable immunizations-flu and pneumonia and possibly hepatitis B and pt to discuss with MD   Psychosocial adjustment  Worked with patient to identify barriers to care and solutions;Role of stress on diabetes;Helped patient identify a support system for diabetes management;Identified and addressed patients feelings and concerns about diabetes    Personal strategies to promote health  Lifestyle issues that need to be addressed for better diabetes care;Review risk of smoking and offered smoking cessation;Helped patient develop diabetes management plan for (enter comment)  Individualized Plan for Diabetes Self-Management Training:   Learning Objective:  Patient will have a greater understanding of diabetes self-management. Patient education plan is to attend individual and/or group sessions per assessed needs and concerns.   Plan:   Patient Instructions  Ask Dr Gershon Crane'Connell about RX for Glucagon, pneumonia vaccine and using insulin carbohydrate ratio for meals and sliding scale for correction of high blood  sugars Also discuss with Dr. Gershon Crane'Connell about need for decreasing AM Novolin 70/30 dose Get urine ketone strips Quit  Smoking-consider smoking cessation classeds Make a dentist  Appointment soon Call [place] with update after dr visit -[phone] Return for appointment/classes on:  Call if wants to schedule another appointment   Expected Outcomes:    positive Education material provided: Foot care handout, Kidney Function handout, Class 2 packet, Quit smoking classes, sick day care, depression and diabetes handout  If problems or questions, patient to contact team via:  [phone]  Future DSME appointment:

## 2018-06-08 NOTE — Patient Instructions (Addendum)
Ask Dr Gershon Crane'Connell about RX for Glucagon, pneumonia vaccine and using insulin carbohydrate ratio for meals and sliding scale for correction of high blood sugars Also discuss with Dr. Gershon Crane'Connell about need for decreasing AM Novolin 70/30 dose Get urine ketone strips Quit  Smoking-consider smoking cessation classeds Make a dentist  Appointment soon Call [place] with update after dr visit [phone] Return for appointment/classes on:  Call if wants to schedule another appointment

## 2018-06-10 ENCOUNTER — Ambulatory Visit: Payer: Self-pay | Admitting: Medical

## 2018-06-14 ENCOUNTER — Encounter: Payer: Self-pay | Admitting: Dietician
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
## 2018-06-21 ENCOUNTER — Encounter: Payer: Self-pay | Admitting: Medical

## 2018-07-06 ENCOUNTER — Encounter: Payer: Self-pay | Admitting: Dietician

## 2018-07-06 NOTE — Progress Notes (Signed)
Have not heard from pt-called pt-he reports that he sees Dr. Gershon Crane'Connell on 07-08-18; pt does not want to schedule any FU with me now but wants to wait and discuss with MD about scheduling another appt -asked pt to call me with update after MD appt

## 2018-07-09 DIAGNOSIS — E10649 Type 1 diabetes mellitus with hypoglycemia without coma: Secondary | ICD-10-CM | POA: Diagnosis not present

## 2018-07-09 DIAGNOSIS — E1159 Type 2 diabetes mellitus with other circulatory complications: Secondary | ICD-10-CM | POA: Diagnosis not present

## 2018-07-09 DIAGNOSIS — E1069 Type 1 diabetes mellitus with other specified complication: Secondary | ICD-10-CM | POA: Diagnosis not present

## 2018-07-27 ENCOUNTER — Encounter: Payer: Self-pay | Admitting: Dietician

## 2018-07-27 NOTE — Progress Notes (Signed)
Have not heard from Nathan Mccullough-called Nathan Mccullough and he reports that he saw Dr. Gershon Crane'Connell on 07-08-18 and was switched to [place]-Bissauresiba and Novolog but it is very expensive so his insurance company is working with MD to choose insulins that will cost less for him but be effective.  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 Nathan Mccullough to call me with update once his MD decides on best insulin regimen for him. He plans to schedule another FU visit after that.

## 2018-08-17 DIAGNOSIS — E10649 Type 1 diabetes mellitus with hypoglycemia without coma: Secondary | ICD-10-CM | POA: Diagnosis not present

## 2018-08-17 DIAGNOSIS — E1069 Type 1 diabetes mellitus with other specified complication: Secondary | ICD-10-CM | POA: Diagnosis not present

## 2018-08-17 DIAGNOSIS — E1159 Type 2 diabetes mellitus with other circulatory complications: Secondary | ICD-10-CM | POA: Diagnosis not present

## 2018-08-30 ENCOUNTER — Encounter: Payer: Self-pay | Admitting: Dietician

## 2018-08-30 NOTE — Progress Notes (Signed)
Called pt who requests an appointment  for carb counting review-last MD visit Dr. Gershon Crane'Connell told pt he is not counting carbs correctly. Scheduled appt with dietitian on 09-09-18; asked pt to bring at least 3 days of food records and estimate carb grams and bring to appt

## 2018-09-09 ENCOUNTER — Encounter: Payer: 59 | Attending: "Endocrinology | Admitting: Dietician

## 2018-09-24 ENCOUNTER — Telehealth: Payer: Self-pay | Admitting: Dietician

## 2018-09-24 NOTE — Telephone Encounter (Signed)
Called patient to check on progress/ reschedule his missed appointment from 09/06/18. Received message 2x that call could not be completed. Unable to leave message.

## 2018-10-12 ENCOUNTER — Encounter: Payer: Self-pay | Admitting: Dietician

## 2018-10-12 NOTE — Progress Notes (Signed)
Have not heard back from patient to reschedule his missed appointment from 09/06/18. Sent discharge letter to referring provider.

## 2018-11-25 DIAGNOSIS — E1069 Type 1 diabetes mellitus with other specified complication: Secondary | ICD-10-CM | POA: Diagnosis not present

## 2018-11-25 DIAGNOSIS — E10649 Type 1 diabetes mellitus with hypoglycemia without coma: Secondary | ICD-10-CM | POA: Diagnosis not present

## 2018-11-25 DIAGNOSIS — E1159 Type 2 diabetes mellitus with other circulatory complications: Secondary | ICD-10-CM | POA: Diagnosis not present

## 2018-12-30 DIAGNOSIS — Z0100 Encounter for examination of eyes and vision without abnormal findings: Secondary | ICD-10-CM | POA: Diagnosis not present

## 2019-01-03 ENCOUNTER — Telehealth: Payer: Self-pay | Admitting: Medical

## 2019-01-03 DIAGNOSIS — E119 Type 2 diabetes mellitus without complications: Secondary | ICD-10-CM | POA: Diagnosis not present

## 2019-01-03 NOTE — Telephone Encounter (Signed)
Dismissal letter in [employer]  °

## 2019-01-26 ENCOUNTER — Emergency Department (HOSPITAL_COMMUNITY)
Admission: EM | Admit: 2019-01-26 | Discharge: 2019-01-27 | Disposition: A | Discharge status: Home / Self Care | Payer: 59 | Attending: Emergency Medicine | Admitting: Emergency Medicine

## 2019-01-26 DIAGNOSIS — R112 Nausea with vomiting, unspecified: Secondary | ICD-10-CM | POA: Diagnosis not present

## 2019-01-26 DIAGNOSIS — R509 Fever, unspecified: Secondary | ICD-10-CM | POA: Diagnosis not present

## 2019-01-26 DIAGNOSIS — Z5321 Procedure and treatment not carried out due to patient leaving prior to being seen by health care provider: Secondary | ICD-10-CM | POA: Insufficient documentation

## 2019-01-26 DIAGNOSIS — M791 Myalgia, unspecified site: Secondary | ICD-10-CM | POA: Insufficient documentation

## 2019-01-26 LAB — CBG MONITORING, ED: GLUCOSE-CAPILLARY: 146 mg/dL — AB (ref 70–99)

## 2019-01-26 NOTE — ED Triage Notes (Signed)
Pt reports flu like S/S X1 day. Fever/chills, body aches, N/V. Wife tested positive for flu this AM.

## 2019-01-26 NOTE — ED Notes (Signed)
Pt wants to leave. Pt encouraged to stay. Pt refuses and gives back labels. Pt seen leaving lobby. 

## 2019-04-25 ENCOUNTER — Encounter: Payer: 59 | Attending: "Endocrinology | Admitting: Dietician

## 2019-04-25 ENCOUNTER — Encounter: Payer: Self-pay | Admitting: Dietician

## 2019-04-25 ENCOUNTER — Other Ambulatory Visit: Payer: Self-pay

## 2019-04-25 VITALS — BP 112/70 | Ht 72.0 in | Wt 173.0 lb

## 2019-04-25 DIAGNOSIS — E10649 Type 1 diabetes mellitus with hypoglycemia without coma: Secondary | ICD-10-CM | POA: Insufficient documentation

## 2019-04-25 DIAGNOSIS — E109 Type 1 diabetes mellitus without complications: Secondary | ICD-10-CM

## 2019-04-25 NOTE — Progress Notes (Signed)
Diabetes Self-Management Education  Visit Type: First/Initial  Appt. Start Time: 1530 Appt. End Time: 1645  04/25/2019  [name], identified by name and date of birth, is a 43 y.o. male with a diagnosis of Diabetes: Type 1.   ASSESSMENT  Blood pressure 112/70, height 6' (1.829 m), weight 173 lb (78.5 kg). Body mass index is 23.46 kg/m.  Diabetes Self-Management Education - 04/25/19 1851      Visit Information   Visit Type  First/Initial      Initial Visit   Diabetes Type  Type 1      Health Coping   How would you rate your overall health?  Good      Psychosocial Assessment   Patient Belief/Attitude about Diabetes  Motivated to manage diabetes    Self-care barriers  None    Self-management support  Friends;Doctor's office    Other persons present  Spouse/SO    Patient Concerns  Glycemic Control;Healthy Lifestyle   quit smoking, prevent complications,    Special Needs  None    Preferred Learning Style  No preference indicated;Hands on;Visual;Auditory    Learning Readiness  Ready    What is the last grade level you completed in school?  college      Pre-Education Assessment   Patient understands the diabetes disease and treatment process.  Demonstrates understanding / competency    Patient understands incorporating nutritional management into lifestyle.  Needs Review    Patient undertands incorporating physical activity into lifestyle.  Demonstrates understanding / competency    Patient understands using medications safely.  Needs Review    Patient understands monitoring blood glucose, interpreting and using results  Demonstrates understanding / competency    Patient understands prevention, detection, and treatment of acute complications.  Needs Review    Patient understands prevention, detection, and treatment of chronic complications.  Needs Review    Patient understands how to develop strategies to address psychosocial issues.  Needs Review    Patient understands  how to develop strategies to promote health/change behavior.  Needs Review      Complications   Last HgB A1C per patient/outside source  7.1 %   03-2019   How often do you check your blood sugar?  --   wears Libre-checks glucose every 1 hr (also as [facility] meter-checks  3-4x/day   Fasting Blood glucose range (mg/dL)  95-621;308-657;846-962;>95270-129;130-179;180-200;>200    Postprandial Blood glucose range (mg/dL)  <84;>132;440-102;725-366;44-034<70;>200;180-200;130-179;70-129    Have you had a dilated eye exam in the past 12 months?  Yes   02-2019   Have you had a dental exam in the past 12 months?  No   2 yrs ago   Are you checking your feet?  Yes    How many days per week are you checking your feet?  7      Dietary Intake   Breakfast  eats breakfast 8:45a-eats 1-2 eggs and 1-2 pieces toast + orange juice with sugar added if BG low    Snack (morning)  10a, 11a, 12p=trail mix, peanut butter jelly sandwich, carrots, celery, cucumbers    Lunch  eats lunch 1-2p-eats [employer] 6 days/wk-eats only chicken nuggets (grilled or fried)    Snack (afternoon)  3p, 4p, 5p=trail mix, peanut butter jelly sandwich, carrots, celery, cucumbers    Dinner  eats supper 8p-meat and vegetables or spaghetti (eats out 1-2x/wk); eats sweets 2-3x/wk    Snack (evening)  none    Beverage(s)  drinks water 8+x/day, sweetened beverages 6-7x/day and fruit juice  2-3x/wk (to treat low BG's)      Exercise   Exercise Type  Light (walking / raking leaves);ADL's   walks 12 miles/day at work 6 days/wk   How many days per week to you exercise?  6    How many minutes per day do you exercise?  120    Total minutes per week of exercise  720      Patient Education   Previous Diabetes Education  Yes (please comment)    Nutrition management   Role of diet in the treatment of diabetes and the relationship between the three main macronutrients and blood glucose level;Carbohydrate counting;Meal timing in regards to the patients' current diabetes medication.    Physical activity and exercise    Role of exercise on diabetes management, blood pressure control and cardiac health.    Medications  Taught/reviewed insulin injection, site rotation, insulin storage and needle disposal.;Reviewed patients medication for diabetes, action, purpose, timing of dose and side effects.   discussed insulin pump therapy and gave pt info packets on Medtronic, Tandem, Omnipod pumps   Monitoring  Purpose and frequency of SMBG.;Yearly dilated eye exam;Identified appropriate SMBG and/or A1C goals.;Taught/discussed recording of test results and interpretation of SMBG.    Acute complications  Taught treatment of hypoglycemia - the 15 rule.;Discussed and identified patients' treatment of hyperglycemia.    Chronic complications  Relationship between chronic complications and blood glucose control;Dental care;Retinopathy and reason for yearly dilated eye exams    Psychosocial adjustment  Role of stress on diabetes    Personal strategies to promote health  Lifestyle issues that need to be addressed for better diabetes care;Review risk of smoking and offered smoking cessation      Outcomes   Expected Outcomes  Demonstrated interest in learning. Expect positive outcomes       Individualized Plan for Diabetes Self-Management Training:   Learning Objective:  Patient will have a greater understanding of diabetes self-management. Patient education plan is to attend individual and/or group sessions per assessed needs and concerns.   Plan:   Continue to check sugar every 1 hr with Josephine Igo + occasional checks with meter Bring blood sugar records to next appointment Eat 3 meals/day and 1-2 snacks per day Space meals 4-6 hours apart Avoid sweetened beverages and fruit juices unless treating low BG Drink plenty of water Limit intake of sweets, snack foods and fried foods Eat 60-75 grams carbs/meal + protein Eat 15 grams carbs/snack + protein Count carb grams accurately Take Humalog 15 min. Prior to eating when able   Complete a 3 day food record and bring to next appointment-estimate carb grams Quit smoking Make dentist appointment when able Quit smoking Carry glucose tablets, candy and snacks at all times Rotate injection sites and Lane sites Review insulin pump packets and visit pump websites If continues to have low BG's, consider changing insulin carb ratio on work days-try 1:10 Return for appointment on 05-16-19   Expected Outcomes:  Demonstrated interest in learning. Expect positive outcomes  Education material provided: General meal planning guidelines, Food Group handout, high and low  BG handouts, Medtronic, Tandem and Omnipod pump packets  If problems or questions, patient to contact team via: [phone]  Future DSME appointment:  05-16-19

## 2019-04-25 NOTE — Patient Instructions (Addendum)
Continue to check sugar every 1 hr with Nathan Mccullough + occasional checks with meter  Bring blood sugar records to the next appointment/class  Eat 3 meals day and  1-2  snacks a day  Space meals 4-6 hours apart  Avoid sugar sweetened drinks (soda, tea, coffee, sports drinks, juices) unless treating a low BG  Drink plenty of water  Limit intake of sweets, snack foods and fried foods  Eat 60-75 grams  carbs/meal + protein  Eat 15 grams carbs/snack + protein   Count carb grams accurately  Take Humalog 15 min prior to eating when able  Complete 3 Day Food Record and bring to next appt-estimate carb grams  Quit smoking  Make a  dentist  appointment when able  Quit smoking  Carry fast acting glucose and a snack at all times  If continues to have low BG's consider changing insulin carb ratio on work days-try 1:10  Rotate injection sites  Review insulin pump packets and visit pump websites  Return for appointment/classes on:  05-16-19

## 2019-05-16 ENCOUNTER — Encounter: Payer: 59 | Attending: "Endocrinology | Admitting: Dietician

## 2019-05-16 ENCOUNTER — Other Ambulatory Visit: Payer: Self-pay

## 2019-05-16 ENCOUNTER — Encounter: Payer: Self-pay | Admitting: Dietician

## 2019-05-16 VITALS — Ht 72.0 in | Wt 174.3 lb

## 2019-05-16 DIAGNOSIS — E109 Type 1 diabetes mellitus without complications: Secondary | ICD-10-CM

## 2019-05-16 DIAGNOSIS — E10649 Type 1 diabetes mellitus with hypoglycemia without coma: Secondary | ICD-10-CM | POA: Diagnosis present

## 2019-05-16 NOTE — Patient Instructions (Signed)
   Daily needs for carbohydrate is about 225-275grams.   Divided into 3 meals + 2 snacks, that would be about 60grams for each meal and 20-30 grams for each snack.   Include a protein food with each meal to help keep blood sugar steady -- like cheese or eggs with grits in the morning.   Practice adding up carb grams with meals and snacks.

## 2019-05-16 NOTE — Progress Notes (Signed)
Diabetes Self-Management Education  Visit Type:  Follow-up  Appt. Start Time: 1315 Appt. End Time: 1420  05/16/2019  [name], identified by name and date of birth, is a 43 y.o. male with a diagnosis of type 1 Diabetes.    ASSESSMENT  Height 6' (1.829 m), weight 174 lb 4.8 oz (79.1 kg). Body mass index is 23.64 kg/m.   Diabetes Self-Management Education - 05/16/19 1322      Complications   Fasting Blood glucose range (mg/dL)  16-10970-129   usually <604<100   Postprandial Blood glucose range (mg/dL)  <54;09-811;914-782;956-213;>086<70;70-129;130-179;180-200;>200    Have you had a dilated eye exam in the past 12 months?  Yes    Have you had a dental exam in the past 12 months?  No    Are you checking your feet?  Yes    How many days per week are you checking your feet?  7      Dietary Intake   Breakfast  3 meals and 1-2 snacks daily    Snack (morning)  none    Lunch  grilled chicken, hot dogs    Snack (afternoon)  uncrustable PBJ sandwich if needed for decreasing BG    Dinner  steak with salad, roast beef sandwich, pancakes with egg, no syrup    Snack (evening)  peantu butter crackers (packaged)    Beverage(s)  vitamin water, juice or regular soda to treat low BGs      Exercise   Exercise Type  Light (walking / raking leaves)    How many days per week to you exercise?  6    How many minutes per day do you exercise?  120    Total minutes per week of exercise  720      Patient Education   Nutrition management   Role of diet in the treatment of diabetes and the relationship between the three main macronutrients and blood glucose level;Food label reading, portion sizes and measuring food.;Carbohydrate counting;Meal options for control of blood glucose level and chronic complications.;Other (comment)   calculated daily and mealtime carb needs, healthy carb choic   Physical activity and exercise   Role of exercise on diabetes management, blood pressure control and cardiac health.    Monitoring  Taught/discussed  recording of test results and interpretation of SMBG.    Acute complications  Taught treatment of hypoglycemia - the 15 rule.       Learning Objective:  Patient will have a greater understanding of diabetes self-management. Patient education plan is to attend individual and/or group sessions per assessed needs and concerns.  Notes:    Patient's food diary indicates some fluctuating carb intake. He is using sugar sweetened beverages to treat low BGs, sometimes in large amounts due to very low readings (some in 20s).   Patient has been avoiding carbohydrate foods at times in effort to help his blood sugars, but is also still experiencing frequent low BGs. BG drops when he is especially active at work.   He will continue to keep a food diary and will practice estimating carb grams in his meals.   He will follow-up with RN after next MD appointment.   Plan:   Patient Instructions   Daily needs for carbohydrate is about 225-275grams.   Divided into 3 meals + 2 snacks, that would be about 60grams for each meal and 20-30 grams for each snack.   Include a protein food with each meal to help keep blood sugar steady -- like cheese  or eggs with grits in the morning.   Practice adding up carb grams with meals and snacks.     Expected Outcomes:  Good; patient is motivated to make lifestyle changes to control diabetes  Education material provided: Planning A Balanced Meal; Quick and Easy Meal Ideas (menus)  If problems or questions, patient to contact team via:  Phone and Email  Future DSME appointment: - RN visit TBD

## 2019-07-04 ENCOUNTER — Encounter: Payer: Self-pay | Admitting: Dietician

## 2019-07-04 NOTE — Progress Notes (Signed)
Have not heard from pt. Called pt-he has not seen Dr. Honor Junes yet-has appointment in 07-2019. Does not want to schedule any further FU until he sees him. Pt to call us if he decides to schedule another appointment.

## 2019-07-12 ENCOUNTER — Encounter (HOSPITAL_COMMUNITY): Payer: Self-pay | Admitting: Emergency Medicine

## 2019-07-12 ENCOUNTER — Ambulatory Visit (HOSPITAL_COMMUNITY)
Admission: EM | Admit: 2019-07-12 | Discharge: 2019-07-12 | Disposition: A | Discharge status: Home / Self Care | Payer: 59 | Attending: Family Medicine | Admitting: Family Medicine

## 2019-07-12 ENCOUNTER — Other Ambulatory Visit: Payer: Self-pay

## 2019-07-12 DIAGNOSIS — Z20828 Contact with and (suspected) exposure to other viral communicable diseases: Secondary | ICD-10-CM | POA: Insufficient documentation

## 2019-07-12 DIAGNOSIS — E1065 Type 1 diabetes mellitus with hyperglycemia: Secondary | ICD-10-CM | POA: Diagnosis not present

## 2019-07-12 DIAGNOSIS — F319 Bipolar disorder, unspecified: Secondary | ICD-10-CM | POA: Insufficient documentation

## 2019-07-12 DIAGNOSIS — Z79899 Other long term (current) drug therapy: Secondary | ICD-10-CM | POA: Insufficient documentation

## 2019-07-12 DIAGNOSIS — F17210 Nicotine dependence, cigarettes, uncomplicated: Secondary | ICD-10-CM | POA: Diagnosis not present

## 2019-07-12 DIAGNOSIS — J4540 Moderate persistent asthma, uncomplicated: Secondary | ICD-10-CM | POA: Insufficient documentation

## 2019-07-12 DIAGNOSIS — Z880 Allergy status to penicillin: Secondary | ICD-10-CM | POA: Insufficient documentation

## 2019-07-12 DIAGNOSIS — K529 Noninfective gastroenteritis and colitis, unspecified: Secondary | ICD-10-CM | POA: Diagnosis not present

## 2019-07-12 DIAGNOSIS — I110 Hypertensive heart disease with heart failure: Secondary | ICD-10-CM | POA: Insufficient documentation

## 2019-07-12 DIAGNOSIS — E785 Hyperlipidemia, unspecified: Secondary | ICD-10-CM | POA: Insufficient documentation

## 2019-07-12 DIAGNOSIS — I509 Heart failure, unspecified: Secondary | ICD-10-CM | POA: Insufficient documentation

## 2019-07-12 DIAGNOSIS — Z794 Long term (current) use of insulin: Secondary | ICD-10-CM | POA: Diagnosis not present

## 2019-07-12 DIAGNOSIS — R1084 Generalized abdominal pain: Secondary | ICD-10-CM | POA: Diagnosis present

## 2019-07-12 LAB — POCT URINALYSIS DIP (DEVICE)
Bilirubin Urine: NEGATIVE
Glucose, UA: 500 mg/dL — AB
Hgb urine dipstick: NEGATIVE
Ketones, ur: NEGATIVE mg/dL
Leukocytes,Ua: NEGATIVE
Nitrite: NEGATIVE
Protein, ur: NEGATIVE mg/dL
Specific Gravity, Urine: 1.015 (ref 1.005–1.030)
Urobilinogen, UA: 0.2 mg/dL (ref 0.0–1.0)
pH: 6.5 (ref 5.0–8.0)

## 2019-07-12 NOTE — ED Triage Notes (Signed)
Pt states for the last three days hes had abdominal pain, diarrhea, and vomiting. Has been able to keep down liquids. Pt here for covid test to be sure its not covid due to hx of diabetes.

## 2019-07-12 NOTE — ED Provider Notes (Signed)
[facility]    CSN: [id number] Arrival date & time: 07/12/19  1314     History   Chief Complaint Chief Complaint  Patient presents with  . Abdominal Pain  . Diarrhea  . Emesis    HPI Nathan Mccullough is a 43 y.o. male.   Patient is a 43 year old male with past medical history of allergy, asthma, bipolar, CHF, diabetes type 1.  He presents today with generalized abdominal cramping, diarrhea and vomiting.  This started early Monday morning after eating [employer] Sunday evening.  Reports that he woke up with severe abdominal cramping.  He has been having 3-4 episodes of diarrhea and 3-4 episodes of vomiting daily since.  Reports symptoms are somewhat better today.  He has been holding down fluids and he ate a bowl of grits and held that down.  He is not currently nauseous.  Generalized abdominal soreness.  Blood sugars have been slightly elevated.  Last recorded blood sugar was 220 and he took a dose of insulin which brought it down.  Reports urine is clear.  Would like to be COVID tested while he is here.  No cough, chest congestion, fever, chills or body aches, night sweats, dysuria, hematuria, urinary frequency, polydipsia, polyphagia, weakness, chest pain or shortness of breath.  ROS per HPI      Past Medical History:  Diagnosis Date  . Allergy   . Asthma   . Bipolar depression ([place])   . CHF (congestive heart failure) ([place]) 2017  . Hyperlipidemia   . IDDM (insulin dependent diabetes mellitus) ([place])    since age 41yo  . Smoker     Patient Active Problem List   Diagnosis Date Noted  . Abnormal echocardiogram 04/22/2018  . Vaccine counseling 04/22/2018  . Abnormal EKG 04/22/2018  . Hyperlipidemia due to type 1 diabetes mellitus ([place]) 03/25/2018  . Hypertension associated with diabetes ([place]) 03/25/2018  . Type 1 diabetes mellitus with hypoglycemia and without coma ([place]) 03/25/2018  . Uncontrolled type 1 diabetes mellitus with hyperglycemia ([place]) 02/26/2018  .  Hyperlipidemia 02/26/2018  . Smoker 02/26/2018  . Moderate persistent asthma 02/26/2018  . Bipolar affective disorder ([place]) 02/26/2018  . Allergic rhinitis due to pollen 02/26/2018    Past Surgical History:  Procedure Laterality Date  . COLONOSCOPY  2017   normal, repeat 2021.  Dr. Merrie Roof, [place], [place]  . NO PAST SURGERIES  02/2018       Home Medications    Prior to Admission medications   Medication Sig Start Date End Date Taking? Authorizing Provider  albuterol (PROVENTIL HFA;VENTOLIN HFA) 108 (90 Base) MCG/ACT inhaler Inhale 2 puffs into the lungs every 6 (six) hours as needed for wheezing or shortness of breath. 02/26/18   Tysinger, Camelia Eng, PA-C  Continuous Blood Gluc Sensor (FREESTYLE LIBRE 14 DAY SENSOR) MISC Inject 1 application into the skin every 14 (fourteen) days. 04/16/19   [provider]  fluticasone (FLONASE) 50 MCG/ACT nasal spray 1 spray by Both Nostrils route daily. Patient taking differently: 2 sprays. 1 spray by Both Nostrils route daily. 02/26/18   Tysinger, Camelia Eng, PA-C  Insulin Glargine, 1 Unit Dial, 300 UNIT/ML SOPN Inject 40 Units into the skin at bedtime. 08/02/18   [provider]  Insulin Isophane & Regular Human (NOVOLIN 70/30 FLEXPEN RELION) (70-30) 100 UNIT/ML PEN Inject 55 units into skin every morning and 45 units every evening Patient not taking: Reported on 04/25/2019 03/10/18   Tysinger, Camelia Eng, PA-C  insulin lispro (HUMALOG) 100 UNIT/ML  KwikPen Inject 7-10 Units into the skin 3 (three) times daily before meals. Uses ICR 1:5 on days he does not work; usually takes 7-8 units/day when working; takes correction 1 unit/50 if BG >150 08/02/18   [provider]  lisinopril (PRINIVIL,ZESTRIL) 5 MG tablet Take 1 tablet (5 mg total) by mouth daily. 02/26/18   Tysinger, Kermit Baloavid S, PA-C  Multiple Vitamin (MULTI-VITAMINS) TABS Take 1 tablet by mouth daily.    [provider]  QUEtiapine (SEROQUEL XR) 200 MG 24 hr tablet Take 1  tablet (200 mg total) by mouth at bedtime. 02/26/18   Tysinger, Kermit Baloavid S, PA-C  QUEtiapine (SEROQUEL) 50 MG tablet Take 1 tablet (50 mg total) by mouth daily. 02/26/18   Tysinger, Kermit Baloavid S, PA-C  rosuvastatin (CRESTOR) 10 MG tablet Take 1 tablet (10 mg total) by mouth daily. Patient not taking: Reported on 04/25/2019 04/22/18   Tysinger, Kermit Baloavid S, PA-C  rosuvastatin (CRESTOR) 20 MG tablet Take 1 tablet by mouth daily. 04/03/19 04/02/20  [provider]  tobramycin-dexamethasone Wallene Dales(TOBRADEX) ophthalmic solution Place 1 drop into the left eye 4 (four) times daily as needed. 01/04/19   [provider]    Family History Family History  Problem Relation Age of Onset  . Cancer Father [id number]       colon  . Asthma Brother   . COPD Maternal Grandfather   . Cancer Paternal Grandfather        lung  . COPD Paternal Grandfather     Social History Social History   Tobacco Use  . Smoking status: Current Every Day Smoker    Packs/day: 0.10    Types: Cigarettes  . Smokeless tobacco: Never Used  Substance Use Topics  . Alcohol use: No    Frequency: Never  . Drug use: No     Allergies   Penicillins, Insulin, Lovastatin, Pollen extract, and Bee pollen   Review of Systems Review of Systems   Physical Exam Triage Vital Signs ED Triage Vitals  Enc Vitals Group     BP 07/12/19 1341 119/75     Pulse Rate 07/12/19 1341 76     Resp 07/12/19 1341 16     Temp 07/12/19 1341 98.4 F (36.9 C)     Temp Source 07/12/19 1341 Oral     SpO2 07/12/19 1341 98 %     Weight --      Height --      Head Circumference --      Peak Flow --      Pain Score 07/12/19 1402 5     Pain Loc --      Pain Edu? --      Excl. in GC? --    No data found.  Updated Vital Signs BP 119/75 (BP Location: Right Arm)   Pulse 76   Temp 98.4 F (36.9 C) (Oral)   Resp 16   SpO2 98%   Visual Acuity Right Eye Distance:   Left Eye Distance:   Bilateral Distance:    Right Eye Near:   Left Eye Near:     Bilateral Near:     Physical Exam Vitals signs and nursing note reviewed.  Constitutional:      General: He is not in acute distress.    Appearance: He is well-developed and normal weight. He is not ill-appearing, toxic-appearing or diaphoretic.  HENT:     Head: Normocephalic and atraumatic.  Eyes:     Conjunctiva/sclera: Conjunctivae normal.  Neck:     Musculoskeletal:  Neck supple.  Cardiovascular:     Rate and Rhythm: Normal rate and regular rhythm.     Heart sounds: No murmur.  Pulmonary:     Effort: Pulmonary effort is normal. No respiratory distress.     Breath sounds: Normal breath sounds.  Abdominal:     General: Bowel sounds are normal.     Palpations: Abdomen is soft. There is no hepatomegaly or splenomegaly.     Tenderness: There is generalized abdominal tenderness.  Skin:    General: Skin is warm and dry.     Findings: No rash.  Neurological:     Mental Status: He is alert.  Psychiatric:        Mood and Affect: Mood normal.      UC Treatments / Results  Labs (all labs ordered are listed, but only abnormal results are displayed) Labs Reviewed  POCT URINALYSIS DIP (DEVICE) - Abnormal; Notable for the following components:      Result Value   Glucose, UA 500 (*)    All other components within normal limits  NOVEL CORONAVIRUS, NAA (HOSPITAL ORDER, SEND-OUT TO REF LAB)    EKG   Radiology No results found.  Procedures Procedures (including critical care time)  Medications Ordered in UC Medications - No data to display  Initial Impression / Assessment and Plan / UC Course  I have reviewed the triage vital signs and the nursing notes.  Pertinent labs & imaging results that were available during my care of the patient were reviewed by me and considered in my medical decision making (see chart for details).     Symptoms and exam consistent with gastroenteritis  Patient is keeping down fluids and staying hydrated.  His vital signs are stable. No  concerns for dehydration.  No ketones in his urine. Nontoxic or ill-appearing COVID testing done Recommended bland diet and advance diet as tolerated. Work note given precautions given until COVID results return.  Final Clinical Impressions(s) / UC Diagnoses   Final diagnoses:  Gastroenteritis     Discharge Instructions     I believe that your symptoms are either related to food poisoning or viral illness. We are testing you for COVID.  Precautions and work note given Continue to stay hydrated Advance diet as tolerated but I will do small meals, bland diet for now. Nothing spicy, greasy or fried.  No milk products Your urine did not show anything concerning Follow-up for any continued or worsening symptoms. Continue to monitor your blood sugars.    ED Prescriptions    None     Controlled Substance Prescriptions [place] Controlled Substance Registry consulted? Not Applicable   Janace ArisBast, Alexarae Oliva A, NP 07/12/19 1454

## 2019-07-12 NOTE — Discharge Instructions (Addendum)
I believe that your symptoms are either related to food poisoning or viral illness. We are testing you for COVID.  Precautions and work note given Continue to stay hydrated Advance diet as tolerated but I will do small meals, bland diet for now. Nothing spicy, greasy or fried.  No milk products Your urine did not show anything concerning Follow-up for any continued or worsening symptoms. Continue to monitor your blood sugars.

## 2019-07-14 ENCOUNTER — Telehealth (HOSPITAL_COMMUNITY): Payer: Self-pay | Admitting: Emergency Medicine

## 2019-07-14 LAB — NOVEL CORONAVIRUS, NAA (HOSP ORDER, SEND-OUT TO REF LAB; TAT 18-24 HRS): SARS-CoV-2, NAA: NOT DETECTED

## 2019-07-14 NOTE — Telephone Encounter (Signed)
Patient called about test results. Results negative, all questions answered.

## 2019-08-08 ENCOUNTER — Encounter: Payer: Self-pay | Admitting: Dietician

## 2019-08-08 NOTE — Progress Notes (Signed)
Have not heard from pt 

## 2019-08-11 ENCOUNTER — Ambulatory Visit: Payer: 59 | Admitting: Emergency Medicine

## 2019-09-13 ENCOUNTER — Encounter: Payer: Self-pay | Admitting: Dietician

## 2019-09-13 NOTE — Progress Notes (Signed)
Have not heard from pt-will discharge

## 2019-09-28 ENCOUNTER — Other Ambulatory Visit: Payer: Self-pay

## 2019-09-28 ENCOUNTER — Emergency Department (HOSPITAL_COMMUNITY): Payer: 59

## 2019-09-28 ENCOUNTER — Observation Stay (HOSPITAL_COMMUNITY)
Admission: EM | Admit: 2019-09-28 | Discharge: 2019-09-29 | Disposition: A | Discharge status: Home / Self Care | Payer: 59 | Attending: Cardiology | Admitting: Cardiology

## 2019-09-28 ENCOUNTER — Ambulatory Visit (INDEPENDENT_AMBULATORY_CARE_PROVIDER_SITE_OTHER)
Admission: EM | Admit: 2019-09-28 | Discharge: 2019-09-28 | Disposition: A | Discharge status: Home / Self Care | Payer: 59 | Source: Home / Self Care

## 2019-09-28 ENCOUNTER — Encounter (HOSPITAL_COMMUNITY): Payer: Self-pay | Admitting: Emergency Medicine

## 2019-09-28 ENCOUNTER — Ambulatory Visit (HOSPITAL_COMMUNITY)
Admission: EM | Disposition: A | Discharge status: Home / Self Care | Payer: Self-pay | Source: Home / Self Care | Attending: Emergency Medicine

## 2019-09-28 DIAGNOSIS — I110 Hypertensive heart disease with heart failure: Secondary | ICD-10-CM | POA: Diagnosis not present

## 2019-09-28 DIAGNOSIS — R079 Chest pain, unspecified: Secondary | ICD-10-CM

## 2019-09-28 DIAGNOSIS — I25119 Atherosclerotic heart disease of native coronary artery with unspecified angina pectoris: Secondary | ICD-10-CM | POA: Diagnosis not present

## 2019-09-28 DIAGNOSIS — R9431 Abnormal electrocardiogram [ECG] [EKG]: Secondary | ICD-10-CM

## 2019-09-28 DIAGNOSIS — I319 Disease of pericardium, unspecified: Secondary | ICD-10-CM

## 2019-09-28 DIAGNOSIS — E1065 Type 1 diabetes mellitus with hyperglycemia: Secondary | ICD-10-CM

## 2019-09-28 DIAGNOSIS — Z20828 Contact with and (suspected) exposure to other viral communicable diseases: Secondary | ICD-10-CM | POA: Diagnosis not present

## 2019-09-28 DIAGNOSIS — E109 Type 1 diabetes mellitus without complications: Secondary | ICD-10-CM | POA: Insufficient documentation

## 2019-09-28 DIAGNOSIS — F319 Bipolar disorder, unspecified: Secondary | ICD-10-CM | POA: Diagnosis not present

## 2019-09-28 DIAGNOSIS — Z794 Long term (current) use of insulin: Secondary | ICD-10-CM | POA: Diagnosis not present

## 2019-09-28 DIAGNOSIS — I509 Heart failure, unspecified: Secondary | ICD-10-CM | POA: Insufficient documentation

## 2019-09-28 DIAGNOSIS — I300 Acute nonspecific idiopathic pericarditis: Secondary | ICD-10-CM | POA: Diagnosis not present

## 2019-09-28 DIAGNOSIS — I309 Acute pericarditis, unspecified: Secondary | ICD-10-CM | POA: Diagnosis present

## 2019-09-28 DIAGNOSIS — Z880 Allergy status to penicillin: Secondary | ICD-10-CM | POA: Diagnosis not present

## 2019-09-28 DIAGNOSIS — J45909 Unspecified asthma, uncomplicated: Secondary | ICD-10-CM | POA: Insufficient documentation

## 2019-09-28 DIAGNOSIS — E785 Hyperlipidemia, unspecified: Secondary | ICD-10-CM | POA: Diagnosis not present

## 2019-09-28 DIAGNOSIS — Z79899 Other long term (current) drug therapy: Secondary | ICD-10-CM | POA: Insufficient documentation

## 2019-09-28 DIAGNOSIS — F17210 Nicotine dependence, cigarettes, uncomplicated: Secondary | ICD-10-CM | POA: Insufficient documentation

## 2019-09-28 HISTORY — PX: LEFT HEART CATH AND CORONARY ANGIOGRAPHY: CATH118249

## 2019-09-28 LAB — CBC
HCT: 45.6 % (ref 39.0–52.0)
HCT: 45.3 % (ref 39.0–52.0)
Hemoglobin: 14.9 g/dL (ref 13.0–17.0)
Hemoglobin: 15.3 g/dL (ref 13.0–17.0)
MCH: 32 pg (ref 26.0–34.0)
MCH: 32.2 pg (ref 26.0–34.0)
MCHC: 32.7 g/dL (ref 30.0–36.0)
MCHC: 33.8 g/dL (ref 30.0–36.0)
MCV: 97.9 fL (ref 80.0–100.0)
MCV: 95.4 fL (ref 80.0–100.0)
Platelets: 329 10*3/uL (ref 150–400)
Platelets: 313 10*3/uL (ref 150–400)
RBC: 4.66 MIL/uL (ref 4.22–5.81)
RBC: 4.75 MIL/uL (ref 4.22–5.81)
RDW: 11.4 % — ABNORMAL LOW (ref 11.5–15.5)
RDW: 11.3 % — ABNORMAL LOW (ref 11.5–15.5)
WBC: 10.7 10*3/uL — ABNORMAL HIGH (ref 4.0–10.5)
WBC: 10.7 10*3/uL — ABNORMAL HIGH (ref 4.0–10.5)
nRBC: 0 % (ref 0.0–0.2)
nRBC: 0 % (ref 0.0–0.2)

## 2019-09-28 LAB — LIPID PANEL
Cholesterol: 142 mg/dL (ref 0–200)
HDL: 42 mg/dL (ref >40)
LDL Cholesterol: 90 mg/dL (ref 0–99)
Total CHOL/HDL Ratio: 3.4 RATIO
Triglycerides: 50 mg/dL (ref <150)
VLDL: 10 mg/dL (ref 0–40)

## 2019-09-28 LAB — BASIC METABOLIC PANEL
Anion gap: 11 (ref 5–15)
BUN: 7 mg/dL (ref 6–20)
CO2: 23 mmol/L (ref 22–32)
Calcium: 9.7 mg/dL (ref 8.9–10.3)
Chloride: 105 mmol/L (ref 98–111)
Creatinine, Ser: 0.87 mg/dL (ref 0.61–1.24)
GFR calc Af Amer: >60 mL/min (ref >60)
GFR calc non Af Amer: >60 mL/min (ref >60)
Glucose, Bld: 82 mg/dL (ref 70–99)
Potassium: 3.8 mmol/L (ref 3.5–5.1)
Sodium: 139 mmol/L (ref 135–145)

## 2019-09-28 LAB — APTT: aPTT: 36 seconds (ref 24–36)

## 2019-09-28 LAB — GLUCOSE, CAPILLARY
Glucose-Capillary: 50 mg/dL — ABNORMAL LOW (ref 70–99)
Glucose-Capillary: 88 mg/dL (ref 70–99)

## 2019-09-28 LAB — HIV ANTIBODY (ROUTINE TESTING W REFLEX): HIV Screen 4th Generation wRfx: NONREACTIVE

## 2019-09-28 LAB — SEDIMENTATION RATE: Sed Rate: 1 mm/hr (ref 0–16)

## 2019-09-28 LAB — CREATININE, SERUM
Creatinine, Ser: 0.93 mg/dL (ref 0.61–1.24)
GFR calc Af Amer: >60 mL/min (ref >60)
GFR calc non Af Amer: >60 mL/min (ref >60)

## 2019-09-28 LAB — C-REACTIVE PROTEIN: CRP: <0.8 mg/dL (ref <1.0)

## 2019-09-28 LAB — SARS CORONAVIRUS 2 BY RT PCR (HOSPITAL ORDER, PERFORMED IN ~~LOC~~ HOSPITAL LAB): SARS Coronavirus 2: NEGATIVE

## 2019-09-28 LAB — TROPONIN I (HIGH SENSITIVITY)
Troponin I (High Sensitivity): <2 ng/L (ref <18)
Troponin I (High Sensitivity): 17 ng/L (ref <18)

## 2019-09-28 LAB — PROTIME-INR
INR: 1.1 (ref 0.8–1.2)
Prothrombin Time: 13.8 seconds (ref 11.4–15.2)

## 2019-09-28 LAB — CBG MONITORING, ED: Glucose-Capillary: 84 mg/dL (ref 70–99)

## 2019-09-28 SURGERY — LEFT HEART CATH AND CORONARY ANGIOGRAPHY
Anesthesia: LOCAL

## 2019-09-28 MED ORDER — SODIUM CHLORIDE 0.9% FLUSH
3.0000 mL | Freq: Two times a day (BID) | INTRAVENOUS | Status: DC
  Administered 2019-09-28: 3 mL via INTRAVENOUS

## 2019-09-28 MED ORDER — VERAPAMIL HCL 2.5 MG/ML IV SOLN
INTRAVENOUS | Status: AC
  Filled 2019-09-28: qty 2

## 2019-09-28 MED ORDER — HEPARIN SODIUM (PORCINE) 1000 UNIT/ML IJ SOLN
INTRAMUSCULAR | Status: AC
  Filled 2019-09-28: qty 1

## 2019-09-28 MED ORDER — SODIUM CHLORIDE 0.9 % IV SOLN
INTRAVENOUS | Status: AC | PRN
  Administered 2019-09-28: 10 mL/h via INTRAVENOUS

## 2019-09-28 MED ORDER — SODIUM CHLORIDE 0.9% FLUSH: 3.0000 mL | INTRAVENOUS | Status: DC | PRN

## 2019-09-28 MED ORDER — COLCHICINE 0.6 MG PO TABS
0.6000 mg | ORAL_TABLET | Freq: Two times a day (BID) | ORAL | Status: DC
  Administered 2019-09-28 – 2019-09-29 (×2): 0.6 mg via ORAL
  Filled 2019-09-28 (×3): qty 1

## 2019-09-28 MED ORDER — ALBUTEROL SULFATE (2.5 MG/3ML) 0.083% IN NEBU: 3.0000 mL | INHALATION_SOLUTION | Freq: Four times a day (QID) | RESPIRATORY_TRACT | Status: DC | PRN

## 2019-09-28 MED ORDER — FLUTICASONE PROPIONATE 50 MCG/ACT NA SUSP
2.0000 | Freq: Every day | NASAL | Status: DC
  Filled 2019-09-28: qty 16

## 2019-09-28 MED ORDER — SODIUM CHLORIDE 0.9 % IV SOLN: INTRAVENOUS | Status: DC

## 2019-09-28 MED ORDER — ONDANSETRON HCL 4 MG/2ML IJ SOLN: 4.0000 mg | Freq: Four times a day (QID) | INTRAMUSCULAR | Status: DC | PRN

## 2019-09-28 MED ORDER — HEPARIN (PORCINE) IN NACL 1000-0.9 UT/500ML-% IV SOLN
INTRAVENOUS | Status: DC | PRN
  Administered 2019-09-28 (×2): 500 mL

## 2019-09-28 MED ORDER — ACETAMINOPHEN 325 MG PO TABS
650.0000 mg | ORAL_TABLET | ORAL | Status: DC | PRN
  Administered 2019-09-28: 650 mg via ORAL
  Filled 2019-09-28: qty 2

## 2019-09-28 MED ORDER — FENTANYL CITRATE (PF) 100 MCG/2ML IJ SOLN
INTRAMUSCULAR | Status: DC | PRN
  Administered 2019-09-28 (×2): 25 ug via INTRAVENOUS

## 2019-09-28 MED ORDER — SODIUM CHLORIDE 0.9% FLUSH: 3.0000 mL | Freq: Once | INTRAVENOUS | Status: DC

## 2019-09-28 MED ORDER — ASPIRIN 325 MG PO TABS
650.0000 mg | ORAL_TABLET | Freq: Three times a day (TID) | ORAL | Status: DC
  Administered 2019-09-28 – 2019-09-29 (×2): 650 mg via ORAL
  Filled 2019-09-28 (×2): qty 2

## 2019-09-28 MED ORDER — SODIUM CHLORIDE 0.9 % IV SOLN: 250.0000 mL | INTRAVENOUS | Status: DC | PRN

## 2019-09-28 MED ORDER — MIDAZOLAM HCL 2 MG/2ML IJ SOLN
INTRAMUSCULAR | Status: AC
  Filled 2019-09-28: qty 2

## 2019-09-28 MED ORDER — VERAPAMIL HCL 2.5 MG/ML IV SOLN
INTRAVENOUS | Status: DC | PRN
  Administered 2019-09-28: 16:00:00 5 mL via INTRA_ARTERIAL

## 2019-09-28 MED ORDER — SODIUM CHLORIDE 0.9 % IV SOLN: INTRAVENOUS | Status: AC

## 2019-09-28 MED ORDER — INSULIN LISPRO (1 UNIT DIAL) 100 UNIT/ML (KWIKPEN): 7.0000 [IU] | PEN_INJECTOR | Freq: Three times a day (TID) | SUBCUTANEOUS | Status: DC

## 2019-09-28 MED ORDER — INSULIN GLARGINE 100 UNIT/ML ~~LOC~~ SOLN
40.0000 [IU] | Freq: Every day | SUBCUTANEOUS | Status: DC
  Filled 2019-09-28: qty 0.4

## 2019-09-28 MED ORDER — INSULIN LISPRO (1 UNIT DIAL) 100 UNIT/ML (KWIKPEN): 7.0000 [IU] | PEN_INJECTOR | SUBCUTANEOUS | Status: DC | PRN

## 2019-09-28 MED ORDER — MIDAZOLAM HCL 2 MG/2ML IJ SOLN
INTRAMUSCULAR | Status: DC | PRN
  Administered 2019-09-28 (×2): 1 mg via INTRAVENOUS

## 2019-09-28 MED ORDER — HEPARIN SODIUM (PORCINE) 5000 UNIT/ML IJ SOLN
5000.0000 [IU] | Freq: Three times a day (TID) | INTRAMUSCULAR | Status: DC
  Administered 2019-09-28 – 2019-09-29 (×2): 5000 [IU] via SUBCUTANEOUS
  Filled 2019-09-28 (×2): qty 1

## 2019-09-28 MED ORDER — QUETIAPINE FUMARATE ER 200 MG PO TB24
200.0000 mg | ORAL_TABLET | Freq: Every day | ORAL | Status: DC
  Administered 2019-09-28: 200 mg via ORAL
  Filled 2019-09-28 (×2): qty 1

## 2019-09-28 MED ORDER — FENTANYL CITRATE (PF) 100 MCG/2ML IJ SOLN
INTRAMUSCULAR | Status: AC
  Filled 2019-09-28: qty 2

## 2019-09-28 MED ORDER — INSULIN GLARGINE (1 UNIT DIAL) 300 UNIT/ML ~~LOC~~ SOPN: 40.0000 [IU] | PEN_INJECTOR | SUBCUTANEOUS | Status: DC

## 2019-09-28 MED ORDER — LISINOPRIL 2.5 MG PO TABS
2.5000 mg | ORAL_TABLET | Freq: Every day | ORAL | Status: DC
  Administered 2019-09-29: 2.5 mg via ORAL
  Filled 2019-09-28: qty 1

## 2019-09-28 MED ORDER — HEPARIN (PORCINE) IN NACL 1000-0.9 UT/500ML-% IV SOLN
INTRAVENOUS | Status: AC
  Filled 2019-09-28: qty 1000

## 2019-09-28 MED ORDER — ROSUVASTATIN CALCIUM 20 MG PO TABS
20.0000 mg | ORAL_TABLET | Freq: Every day | ORAL | Status: DC
  Administered 2019-09-29: 20 mg via ORAL
  Filled 2019-09-28: qty 1

## 2019-09-28 MED ORDER — HYDRALAZINE HCL 20 MG/ML IJ SOLN: 10.0000 mg | INTRAMUSCULAR | Status: AC | PRN

## 2019-09-28 MED ORDER — NITROGLYCERIN 1 MG/10 ML FOR IR/CATH LAB
INTRA_ARTERIAL | Status: AC
  Filled 2019-09-28: qty 10

## 2019-09-28 MED ORDER — LABETALOL HCL 5 MG/ML IV SOLN: 10.0000 mg | INTRAVENOUS | Status: AC | PRN

## 2019-09-28 MED ORDER — IOHEXOL 350 MG/ML SOLN
INTRAVENOUS | Status: DC | PRN
  Administered 2019-09-28: 16:00:00 45 mL

## 2019-09-28 MED ORDER — LIDOCAINE HCL (PF) 1 % IJ SOLN
INTRAMUSCULAR | Status: DC | PRN
  Administered 2019-09-28: 2 mL via INTRADERMAL

## 2019-09-28 MED ORDER — HEPARIN SODIUM (PORCINE) 5000 UNIT/ML IJ SOLN
4000.0000 [IU] | Freq: Once | INTRAMUSCULAR | Status: AC
  Administered 2019-09-28: 4000 [IU] via INTRAVENOUS

## 2019-09-28 MED ORDER — HEPARIN SODIUM (PORCINE) 5000 UNIT/ML IJ SOLN: 5000.0000 [IU] | Freq: Three times a day (TID) | INTRAMUSCULAR | Status: DC

## 2019-09-28 MED ORDER — ASPIRIN 325 MG PO TABS: 325.0000 mg | ORAL_TABLET | Freq: Three times a day (TID) | ORAL | Status: DC | PRN

## 2019-09-28 MED ORDER — ASPIRIN 81 MG PO CHEW
324.0000 mg | CHEWABLE_TABLET | Freq: Once | ORAL | Status: AC
  Administered 2019-09-28: 324 mg via ORAL
  Filled 2019-09-28: qty 4

## 2019-09-28 MED ORDER — PANTOPRAZOLE SODIUM 40 MG PO TBEC
40.0000 mg | DELAYED_RELEASE_TABLET | Freq: Every day | ORAL | Status: DC
  Administered 2019-09-28 – 2019-09-29 (×2): 40 mg via ORAL
  Filled 2019-09-28 (×3): qty 1

## 2019-09-28 SURGICAL SUPPLY — 13 items
CATH INFINITI 5FR ANG PIGTAIL (CATHETERS) ×2 IMPLANT
CATH INFINITI JR4 5F (CATHETERS) ×2 IMPLANT
CATH OPTITORQUE TIG 4.0 5F (CATHETERS) ×2 IMPLANT
DEVICE RAD COMP TR BAND LRG (VASCULAR PRODUCTS) ×2 IMPLANT
GLIDESHEATH SLEND A-KIT 6F 22G (SHEATH) ×2 IMPLANT
GUIDEWIRE INQWIRE 1.5J.035X260 (WIRE) ×1 IMPLANT
INQWIRE 1.5J .035X260CM (WIRE) ×2
KIT ENCORE 26 ADVANTAGE (KITS) ×2 IMPLANT
KIT HEART LEFT (KITS) ×2 IMPLANT
PACK CARDIAC CATHETERIZATION (CUSTOM PROCEDURE TRAY) ×2 IMPLANT
SYR MEDRAD MARK 7 150ML (SYRINGE) IMPLANT
TRANSDUCER W/STOPCOCK (MISCELLANEOUS) ×2 IMPLANT
TUBING CIL FLEX 10 FLL-RA (TUBING) ×2 IMPLANT

## 2019-09-28 NOTE — H&P (Addendum)
Nathan Mccullough is an 43 y.o. male.   Chief Complaint: Chest pain HPI:   43 y/o [place] American male with hyperlipidemia, type 2 DM, h/o congestive heart failure, tobacco abuse, bipolar depression, presented to urgent care since 10 AM today (6 hrs ago). EKG showed diffuse ST elevation. He was thus taken emergently to cath lab.   Coronary angiography showed no culprit stenosis. Chest pain has improved. Pericarditis was thought to be likely cause. He sees Dr. Gwen Pounds for CHF.   Past Medical History:  Diagnosis Date  . Allergy   . Asthma   . Bipolar depression (HCC)   . CHF (congestive heart failure) (HCC) 2017  . Hyperlipidemia   . IDDM (insulin dependent diabetes mellitus)    since age 55yo  . Smoker     Past Surgical History:  Procedure Laterality Date  . COLONOSCOPY  2017   normal, repeat 2021.  Dr. Elby Showers, [place], [place]  . NO PAST SURGERIES  02/2018    Family History  Problem Relation Age of Onset  . Cancer Father 75       colon  . Asthma Brother   . COPD Maternal Grandfather   . Cancer Paternal Grandfather        lung  . COPD Paternal Grandfather    Social History:  reports that he has been smoking cigarettes. He has been smoking about 0.10 packs per day. He has never used smokeless tobacco. He reports that he does not drink alcohol or use drugs.  Allergies:  Allergies  Allergen Reactions  . Penicillins Anaphylaxis and Itching  . Insulin Other (See Comments)    Hyperglycemia, Humulin R, tolerates Novolog 70/30  . Lovastatin Swelling    abdomen  . Pollen Extract Other (See Comments)    Head pain  . Bee Pollen     Review of Systems  Constitution: Negative for decreased appetite, malaise/fatigue, weight gain and weight loss.  HENT: Negative for congestion.   Eyes: Negative for visual disturbance.  Cardiovascular: Positive for chest pain. Negative for dyspnea on exertion, leg swelling, palpitations and syncope.  Respiratory: Negative for cough.   Endocrine:  Negative for cold intolerance.  Hematologic/Lymphatic: Does not bruise/bleed easily.  Skin: Negative for itching and rash.  Musculoskeletal: Negative for myalgias.  Gastrointestinal: Negative for abdominal pain, nausea and vomiting.  Genitourinary: Negative for dysuria.  Neurological: Negative for dizziness and weakness.  Psychiatric/Behavioral: The patient is not nervous/anxious.   All other systems reviewed and are negative.    Blood pressure 113/67, pulse 71, temperature 98.3 F (36.8 C), temperature source Oral, resp. rate 17, height 5\' 11"  (1.803 m), weight 78.9 kg, SpO2 100 %. Body mass index is 24.27 kg/m.  Physical Exam  Constitutional: He is oriented to person, place, and time. He appears well-developed and well-nourished. No distress.  HENT:  Head: Normocephalic and atraumatic.  Eyes: Pupils are equal, round, and reactive to light. Conjunctivae are normal.  Neck: No JVD present.  Cardiovascular: Normal rate, regular rhythm and intact distal pulses.  No murmur heard. Pulmonary/Chest: Effort normal and breath sounds normal. He has no wheezes. He has no rales.  Abdominal: Soft. Bowel sounds are normal. There is no rebound.  Musculoskeletal:        General: No edema.  Lymphadenopathy:    He has no cervical adenopathy.  Neurological: He is alert and oriented to person, place, and time. No cranial nerve deficit.  Skin: Skin is warm and dry.  Psychiatric: He has a normal mood and affect.  Nursing note and vitals reviewed.   Results for orders placed or performed during the hospital encounter of 09/28/19 (from the past 48 hour(s))  CBG monitoring, ED     Status: None   Collection Time: 09/28/19  2:43 PM  Result Value Ref Range   Glucose-Capillary 84 70 - 99 mg/dL  CBC     Status: Abnormal   Collection Time: 09/28/19  3:19 PM  Result Value Ref Range   WBC 10.7 (H) 4.0 - 10.5 K/uL   RBC 4.66 4.22 - 5.81 MIL/uL   Hemoglobin 14.9 13.0 - 17.0 g/dL   HCT 40.945.6 81.139.0 - 91.452.0 %    MCV 97.9 80.0 - 100.0 fL   MCH 32.0 26.0 - 34.0 pg   MCHC 32.7 30.0 - 36.0 g/dL   RDW 78.211.4 (L) 95.611.5 - 21.315.5 %   Platelets 329 150 - 400 K/uL   nRBC 0.0 0.0 - 0.2 %    Comment: Performed at [facility], [address] [place], [place] [address]  Protime-INR     Status: None   Collection Time: 09/28/19  3:19 PM  Result Value Ref Range   Prothrombin Time 13.8 11.4 - 15.2 seconds   INR 1.1 0.8 - 1.2    Comment: (NOTE) INR goal varies based on device and disease states. Performed at [facility], [address] [place], [place] [address]   APTT     Status: None   Collection Time: 09/28/19  3:19 PM  Result Value Ref Range   aPTT 36 24 - 36 seconds    Comment: Performed at [facility], [address] [place], [place] [address]    Labs:   Lab Results  Component Value Date   WBC 10.7 (H) 09/28/2019   HGB 14.9 09/28/2019   HCT 45.6 09/28/2019   MCV 97.9 09/28/2019   PLT 329 09/28/2019   No results for input(s): NA, K, CL, CO2, BUN, CREATININE, CALCIUM, PROT, BILITOT, ALKPHOS, ALT, AST, GLUCOSE in the last 168 hours.  Invalid input(s): LABALBU  Lipid Panel     Component Value Date/Time   CHOL 186 02/26/2018 0921   TRIG 171 (H) 02/26/2018 0921   HDL 46 02/26/2018 0921   CHOLHDL 4.0 02/26/2018 0921   CHOLHDL 3.1 Ratio 02/04/2010 2040   VLDL 19 02/04/2010 2040   LDLCALC 106 (H) 02/26/2018 0921    BNP (last 3 results) No results for input(s): BNP in the last 8760 hours.  HEMOGLOBIN A1C Lab Results  Component Value Date   HGBA1C 6.0 02/26/2018    Cardiac Panel (last 3 results) No results for input(s): CKTOTAL, CKMB, RELINDX in the last 8760 hours.  Invalid input(s): TROPONINHS  No results found for: CKTOTAL, CKMB, CKMBINDEX   TSH No results for input(s): TSH in the last 8760 hours.   (Not in a hospital admission)     Current Facility-Administered Medications:  .  0.9 %  sodium chloride infusion, , Intravenous, Continuous, Long,  Arlyss RepressJoshua G, MD .  sodium chloride flush (NS) 0.9 % injection 3 mL, 3 mL, Intravenous, Once, Bero, Elmer SowMichael M, MD  Current Outpatient Medications:  .  albuterol (PROVENTIL HFA;VENTOLIN HFA) 108 (90 Base) MCG/ACT inhaler, Inhale 2 puffs into the lungs every 6 (six) hours as needed for wheezing or shortness of breath., Disp: 18 g, Rfl: 1 .  Continuous Blood Gluc Sensor (FREESTYLE LIBRE 14 DAY SENSOR) MISC, Inject 1 application into the skin every 14 (fourteen) days., Disp: , Rfl:  .  fluticasone (FLONASE) 50 MCG/ACT nasal spray, 1 spray by Both Nostrils route daily. (Patient taking differently: 2 sprays. 1 spray by Both Nostrils route daily.), Disp: 16 g, Rfl: 11 .  Insulin Glargine, 1 Unit Dial, 300 UNIT/ML SOPN, Inject 40 Units into the skin at bedtime., Disp: , Rfl:  .  Insulin Isophane & Regular Human (NOVOLIN 70/30 FLEXPEN RELION) (70-30) 100 UNIT/ML PEN, Inject 55 units into skin every morning and 45 units every evening (Patient not taking: Reported on 04/25/2019), Disp: 30 mL, Rfl: 2 .  insulin lispro (HUMALOG) 100 UNIT/ML KwikPen, Inject 7-10 Units into the skin 3 (three) times daily before meals. Uses ICR 1:5 on days he does not work; usually takes 7-8 units/day when working; takes correction 1 unit/50 if BG >150, Disp: , Rfl:  .  lisinopril (PRINIVIL,ZESTRIL) 5 MG tablet, Take 1 tablet (5 mg total) by mouth daily., Disp: 90 tablet, Rfl: 3 .  Multiple Vitamin (MULTI-VITAMINS) TABS, Take 1 tablet by mouth daily., Disp: , Rfl:  .  QUEtiapine (SEROQUEL XR) 200 MG 24 hr tablet, Take 1 tablet (200 mg total) by mouth at bedtime., Disp: 90 tablet, Rfl: 1 .  QUEtiapine (SEROQUEL) 50 MG tablet, Take 1 tablet (50 mg total) by mouth daily., Disp: 90 tablet, Rfl: 1 .  rosuvastatin (CRESTOR) 10 MG tablet, Take 1 tablet (10 mg total) by mouth daily., Disp: 90 tablet, Rfl: 1 .  rosuvastatin (CRESTOR) 20 MG tablet, Take 1 tablet by mouth daily., Disp: , Rfl:  .  tobramycin-dexamethasone (TOBRADEX) ophthalmic  solution, Place 1 drop into the left eye 4 (four) times daily as needed., Disp: , Rfl:    Today's Vitals   09/28/19 1432 09/28/19 1433 09/28/19 1515 09/28/19 1530  BP: 113/67     Pulse: 75  72 71  Resp: 17  (!) 26 17  Temp: 98.3 F (36.8 C)     TempSrc: Oral     SpO2: 100%  100% 100%  Weight:  78.9 kg    Height:  5\' 11"  (1.803 m)     Body mass index is 24.27 kg/m.    CARDIAC STUDIES:  EKG 09/28/2019: Ectopic atrial rhythm with diffuse ST elevation  Echocardiogram pending:  Coronary angiography 09/28/2019: LM: None LAD: Mid 30% disease LCx: Normal RCA: Mid 30% disease  Normal LVEF Normal LVEDP  No culprit stenosis found. Suspect pericarditis as the etiology for chest pain and ST elevation.  Assessment/Plan  43 y/o [place] American male with hyperlipidemia, type 2 DM, h/o congestive heart failure, tobacco abuse, bipolar depression, with chest pain, ST elevation.  Chest pain, ST elevation: No culprit stenosis found. Suspect acute pericarditis.  Recommend high dose Aspirin + PPI, colchicine 0.6 mg bid.  Will monitor overnight and obtain echocardiogram.   DM, bipolar depression: Continue baseline management.  Nigel Mormon, MD 09/28/2019, 3:45 PM [facility] Pager: [phone] Office: [phone] If no answer: [phone]

## 2019-09-28 NOTE — Progress Notes (Signed)
Pharmacy Note Re: Home insulin pens  Spoke with patient at length to ask to store his insulin pens in the main pharmacy, and substitute per our formulary.  Patient politely, but adamantly refuses to use "hospital supplied" insulin, and insists on keeping his own insulin pens at bedside.  He checks his blood sugar every hour (via continuous glucose monitor), and gives himself Humalog anytime it is over 150.  Also takes Toujeo (insulin glargine) - had already given himself a dose tonight before RN came into room.  Humalog Kwikpen -  Sliding scale per patient > Glucose 150-169 = 7 units     170-189= 9 units     190-210 = 11 units  *uses a max of 40 units in a 24 hr period  [employer]- uses 40 units every night at 8 pm (patient already gave himself his dose tonight at 8 PM.  I will adjust orders in Epic to match what is doing at home.  I asked that he call his nurse to bedside before administering any insulin, and he agreed.  Marguerite Olea, [facility] Clinical Pharmacist Phone [phone]  09/28/2019 9:21 PM

## 2019-09-28 NOTE — Progress Notes (Signed)
Pt arrived to unit, 6cc in TR band. Level 0

## 2019-09-28 NOTE — ED Notes (Signed)
Lanelle Bal, NP spoke to patient.  Patient to go to ED by wheelchair and staff escort.

## 2019-09-28 NOTE — ED Provider Notes (Addendum)
Patient with active chest pain, burning to left chest, which started this morning at 10a. States it started when he felt acutely stressed, but has not resolved. At onset felt shortness of breath, this has improved. States has a cardiac history, has had "skipped beats" and "speeding up and slowing down" states he has an appointment with cardiology next week even. EKG here is changed with new t wave changes noted, per machine eval WPW. Patient provided wheelchair assist to the ER now for further evaluation. Patient verbalized understanding and agreeable to plan.    Zigmund Gottron, NP 09/28/2019 2:10 PM    Zigmund Gottron, NP 09/28/19 1410    Zigmund Gottron, NP 09/30/19 [phone]

## 2019-09-28 NOTE — ED Notes (Signed)
Belongings given to wife, wife in 2 H waiting room with chaplin.

## 2019-09-28 NOTE — ED Provider Notes (Signed)
Emergency Department Provider Note   I have reviewed the triage vital signs and the nursing notes.   HISTORY  Chief Complaint Chest Pain/STEMI   HPI Nathan Mccullough is a 43 y.o. male patient with history of high cholesterol, CHF, pericarditis presents to the emergency department with acute onset left chest pain.  Symptoms began at 10 AM.  He notes pain is sharp in nature without radiation.  He did have some associated diaphoresis.  He follows with cardiology in [facility].  He initially presented to urgent care and after EKG was referred directly to the emergency department.  He arrived through the waiting room.   Upon seeing his EKG I went to evaluate the patient in triage.  He was moved to an acute care room. Continues to have active pain.   Past Medical History:  Diagnosis Date  . Allergy   . Asthma   . Bipolar depression ([place])   . CHF (congestive heart failure) ([place]) 2017  . Hyperlipidemia   . IDDM (insulin dependent diabetes mellitus)    since age 89yo  . Smoker     Patient Active Problem List   Diagnosis Date Noted  . Pericarditis 09/28/2019  . Acute pericarditis 09/28/2019  . Abnormal echocardiogram 04/22/2018  . Vaccine counseling 04/22/2018  . Abnormal EKG 04/22/2018  . Hyperlipidemia due to type 1 diabetes mellitus ([place]) 03/25/2018  . Hypertension associated with diabetes ([place]) 03/25/2018  . Type 1 diabetes mellitus with hypoglycemia and without coma ([place]) 03/25/2018  . Uncontrolled type 1 diabetes mellitus with hyperglycemia ([place]) 02/26/2018  . Hyperlipidemia 02/26/2018  . Smoker 02/26/2018  . Moderate persistent asthma 02/26/2018  . Bipolar affective disorder ([place]) 02/26/2018  . Allergic rhinitis due to pollen 02/26/2018    Past Surgical History:  Procedure Laterality Date  . COLONOSCOPY  2017   normal, repeat 2021.  Dr. Merrie Roof, [place], [place]  . NO PAST SURGERIES  02/2018    Allergies Penicillins, Insulin, Lovastatin, Pollen extract,  and Bee pollen  Family History  Problem Relation Age of Onset  . Cancer Father 12       colon  . Asthma Brother   . COPD Maternal Grandfather   . Cancer Paternal Grandfather        lung  . COPD Paternal Grandfather     Social History Social History   Tobacco Use  . Smoking status: Current Every Day Smoker    Packs/day: 0.10    Types: Cigarettes  . Smokeless tobacco: Never Used  Substance Use Topics  . Alcohol use: No    Frequency: Never  . Drug use: No    Review of Systems  Constitutional: No fever/chills Eyes: No visual changes. ENT: No sore throat. Cardiovascular: Positive chest pain. Respiratory: Denies shortness of breath. Gastrointestinal: No abdominal pain.  No nausea, no vomiting.  No diarrhea.  No constipation. Genitourinary: Negative for dysuria. Musculoskeletal: Negative for back pain. Skin: Negative for rash. Neurological: Negative for headaches, focal weakness or numbness.  10-point ROS otherwise negative.  ____________________________________________   PHYSICAL EXAM:  VITAL SIGNS: ED Triage Vitals  Enc Vitals Group     BP 09/28/19 1432 113/67     Pulse Rate 09/28/19 1432 75     Resp 09/28/19 1432 17     Temp 09/28/19 1432 98.3 F (36.8 C)     Temp Source 09/28/19 1432 Oral     SpO2 09/28/19 1432 100 %     Weight 09/28/19 1433 174 lb (78.9 kg)  Height 09/28/19 1433 5\' 11"  (1.803 m)   Constitutional: Alert and oriented. Well appearing and in no acute distress. Eyes: Conjunctivae are normal.  Head: Atraumatic. Nose: No congestion/rhinnorhea. Mouth/Throat: Mucous membranes are moist.   Neck: No stridor.  Cardiovascular: Normal rate, regular rhythm. Good peripheral circulation. Grossly normal heart sounds.   Respiratory: Normal respiratory effort.  No retractions. Lungs CTAB. Gastrointestinal: Soft and nontender. No distention.  Musculoskeletal: No lower extremity tenderness nor edema. No gross deformities of extremities. Neurologic:   Normal speech and language. No gross focal neurologic deficits are appreciated.  Skin:  Skin is warm, dry and intact. No rash noted.  ____________________________________________   LABS (all labs ordered are listed, but only abnormal results are displayed)  Labs Reviewed  CBC - Abnormal; Notable for the following components:      Result Value   WBC 10.7 (*)    RDW 11.4 (*)    All other components within normal limits  GLUCOSE, CAPILLARY - Abnormal; Notable for the following components:   Glucose-Capillary 50 (*)    All other components within normal limits  SARS CORONAVIRUS 2 BY RT PCR (HOSPITAL ORDER, PERFORMED IN [facility])  BASIC METABOLIC PANEL  PROTIME-INR  APTT  LIPID PANEL  GLUCOSE, CAPILLARY  HIV ANTIBODY (ROUTINE TESTING W REFLEX)  HIV4GL SAVE TUBE  CBC  CREATININE, SERUM  SEDIMENTATION RATE  C-REACTIVE PROTEIN  CBC  CREATININE, SERUM  CBG MONITORING, ED  TROPONIN I (HIGH SENSITIVITY)  TROPONIN I (HIGH SENSITIVITY)   ____________________________________________  EKG  Inferior ST changes concerning for STEMI. Inverted P waves unchanged. Sinus rhythm.  ____________________________________________  RADIOLOGY  Dg Chest Port 1 View  Result Date: 09/28/2019 CLINICAL DATA:  Left chest pain. EXAM: PORTABLE CHEST 1 VIEW COMPARISON:  None. FINDINGS: Both lungs are clear. Negative for a pneumothorax. Heart and mediastinum are within normal limits. Trachea is midline. Bone structures are intact. IMPRESSION: No active disease. Electronically Signed   By: Richarda OverlieAdam  Henn M.D.   On: 09/28/2019 15:43    ____________________________________________   PROCEDURES  Procedure(s) performed:   Procedures  CRITICAL CARE Performed by: Maia PlanJoshua G Long Total critical care time: 30 minutes Critical care time was exclusive of separately billable procedures and treating other patients. Critical care was necessary to treat or prevent imminent or life-threatening  deterioration. Critical care was time spent personally by me on the following activities: development of treatment plan with patient and/or surrogate as well as nursing, discussions with consultants, evaluation of patient's response to treatment, examination of patient, obtaining history from patient or surrogate, ordering and performing treatments and interventions, ordering and review of laboratory studies, ordering and review of radiographic studies, pulse oximetry and re-evaluation of patient's condition.  Alona BeneJoshua Long, MD Emergency Medicine  ____________________________________________   INITIAL IMPRESSION / ASSESSMENT AND PLAN / ED COURSE  Pertinent labs & imaging results that were available during my care of the patient were reviewed by me and considered in my medical decision making (see chart for details).   Patient presents to the emergency department for evaluation.  In being handed the EKG from triage immediately went to evaluate the patient.  He is having somewhat atypical pain although it does continue from 10 AM today.  He does appear to have new ST changes inferiorly although no reciprocal changes.  P waves inverted.  In looking at old tracings these appear new.  Attempted to page Cardiology/STEMI on call to review EKG and history prior to activation given history and prior tracings.  When response delayed, opted for STEMI activation from the ED accounting for the delay to activation.   03:10 PM Spoke with Dr. Jacinto Halim regarding the case and EKG. the EKG definitely shows new ST changes.  Pericarditis remains in the differential however given the acute ST changes and active chest pain Dr. Jacinto Halim will take the patient emergent to the cath lab as a CODE STEMI.   I reviewed all nursing notes, vitals, pertinent old records, EKGs, labs, imaging (as available).  ____________________________________________  FINAL CLINICAL IMPRESSION(S) / ED DIAGNOSES  Final diagnoses:  Chest pain      MEDICATIONS GIVEN DURING THIS VISIT:  Medications  sodium chloride flush (NS) 0.9 % injection 3 mL ( Intravenous MAR Unhold 09/28/19 1814)  0.9 %  sodium chloride infusion (has no administration in time range)  labetalol (NORMODYNE) injection 10 mg (has no administration in time range)  hydrALAZINE (APRESOLINE) injection 10 mg (has no administration in time range)  acetaminophen (TYLENOL) tablet 650 mg (has no administration in time range)  ondansetron (ZOFRAN) injection 4 mg (has no administration in time range)  heparin injection 5,000 Units (has no administration in time range)  0.9 %  sodium chloride infusion (has no administration in time range)  sodium chloride flush (NS) 0.9 % injection 3 mL (has no administration in time range)  sodium chloride flush (NS) 0.9 % injection 3 mL (has no administration in time range)  0.9 %  sodium chloride infusion (has no administration in time range)  heparin injection 5,000 Units (has no administration in time range)  aspirin tablet 325 mg (has no administration in time range)  albuterol (VENTOLIN HFA) 108 (90 Base) MCG/ACT inhaler 2 puff (has no administration in time range)  fluticasone (FLONASE) 50 MCG/ACT nasal spray 2 spray (has no administration in time range)  insulin lispro (HUMALOG) KwikPen 7-10 Units (has no administration in time range)  lisinopril (ZESTRIL) tablet 2.5 mg (has no administration in time range)  QUEtiapine (SEROQUEL XR) 24 hr tablet 200 mg (has no administration in time range)  rosuvastatin (CRESTOR) tablet 20 mg (has no administration in time range)  Insulin Glargine (1 Unit Dial) SOPN 40 Units (has no administration in time range)  aspirin tablet 650 mg (has no administration in time range)  colchicine tablet 0.6 mg (has no administration in time range)  pantoprazole (PROTONIX) EC tablet 40 mg (has no administration in time range)  aspirin chewable tablet 324 mg (324 mg Oral Given 09/28/19 1531)  heparin injection 4,000  Units (4,000 Units Intravenous Given 09/28/19 1529)  0.9 %  sodium chloride infusion (10 mL/hr Intravenous New Bag/Given 09/28/19 1555)  verapamil (ISOPTIN) 2.5 MG/ML injection (has no administration in time range)  heparin 1000 UNIT/ML injection (has no administration in time range)  nitroGLYCERIN 100 mcg/mL intra-arterial injection (has no administration in time range)  Heparin (Porcine) in NaCl 1000-0.9 UT/500ML-% SOLN (has no administration in time range)  midazolam (VERSED) 2 MG/2ML injection (has no administration in time range)  fentaNYL (SUBLIMAZE) 100 MCG/2ML injection (has no administration in time range)    Note:  This document was prepared using Dragon voice recognition software and may include unintentional dictation errors.  Alona Bene, MD, [facility] Emergency Medicine    Long, Arlyss Repress, MD 09/28/19 [phone]

## 2019-09-28 NOTE — ED Notes (Signed)
Report given to cath lab. 

## 2019-09-28 NOTE — ED Notes (Signed)
ST elevation noted on 12 lead

## 2019-09-28 NOTE — ED Triage Notes (Signed)
Pt coming from UC with c/o left chest pain sharp in nature. He reports onset while at work, no radiation but was sweating. Hx of CHF and DM. Requests CBG. A/O x4 NAD.

## 2019-09-28 NOTE — ED Triage Notes (Signed)
Left chest burning started around 10:30 AM.  No reflux, patient reports sob .  No nausea, patient has had an episode on diaphoresis.

## 2019-09-28 NOTE — ED Notes (Signed)
Patient is being discharged from the Urgent [place] and sent to the Emergency Department via wheelchair by staff. Per Augusto Gamble, NP, patient is stable but in need of higher level of care due to Chest pain. Patient is aware and verbalizes understanding of plan of care.  Vitals:   09/28/19 1350  BP: 119/71  Pulse: 76  Resp: 20  Temp: 98.6 F (37 C)  SpO2: 100%

## 2019-09-28 NOTE — Progress Notes (Signed)
Chaplain responded to the Code Stemi.  The chaplain assisted the the patient in connecting their family, and helped the family of the patient locate where the patient was being moved to.  The chaplain also provided emotional support for the family.  Brion Aliment Chaplain Resident For questions concerning this note please contact me by pager [phone]

## 2019-09-29 ENCOUNTER — Observation Stay (HOSPITAL_COMMUNITY): Payer: 59

## 2019-09-29 ENCOUNTER — Encounter (HOSPITAL_COMMUNITY): Payer: Self-pay | Admitting: Cardiology

## 2019-09-29 DIAGNOSIS — E1065 Type 1 diabetes mellitus with hyperglycemia: Secondary | ICD-10-CM | POA: Diagnosis not present

## 2019-09-29 DIAGNOSIS — R079 Chest pain, unspecified: Secondary | ICD-10-CM | POA: Diagnosis not present

## 2019-09-29 DIAGNOSIS — R9431 Abnormal electrocardiogram [ECG] [EKG]: Secondary | ICD-10-CM | POA: Diagnosis not present

## 2019-09-29 DIAGNOSIS — F319 Bipolar disorder, unspecified: Secondary | ICD-10-CM | POA: Diagnosis not present

## 2019-09-29 DIAGNOSIS — I300 Acute nonspecific idiopathic pericarditis: Secondary | ICD-10-CM | POA: Diagnosis not present

## 2019-09-29 LAB — ECHOCARDIOGRAM COMPLETE
Height: 72 in
Weight: 2736 oz

## 2019-09-29 MED ORDER — COLCHICINE 0.6 MG PO TABS: 0.6000 mg | ORAL_TABLET | Freq: Two times a day (BID) | ORAL | 0 refills | Status: DC

## 2019-09-29 MED ORDER — ASPIRIN 325 MG PO TABS: 650.0000 mg | ORAL_TABLET | Freq: Two times a day (BID) | ORAL | 0 refills | Status: AC

## 2019-09-29 MED ORDER — PANTOPRAZOLE SODIUM 40 MG PO TBEC: 40.0000 mg | DELAYED_RELEASE_TABLET | Freq: Every day | ORAL | 0 refills | Status: DC

## 2019-09-29 MED FILL — Heparin Sodium (Porcine) Inj 1000 Unit/ML: INTRAMUSCULAR | Qty: 10 | Status: AC

## 2019-09-29 MED FILL — Nitroglycerin IV Soln 100 MCG/ML in D5W: INTRA_ARTERIAL | Qty: 10 | Status: AC

## 2019-09-29 NOTE — Discharge Summary (Signed)
Physician Discharge Summary  Patient ID: Nathan Mccullough MRN: [id number] DOB/AGE: 08-29-1976 43 y.o.  Admit date: 09/28/2019 Discharge date: 09/29/2019  Primary Discharge Diagnosis: Chest pain Acute pericarditis  Secondary Discharge Diagnosis: Hypertension Type 1 DM Bipolar disorder   Hospital Course:   43 y/o [place] with hypertension, hyperlipidemia, type 1 DM, h/o congestive heart failure, tobacco abuse, bipolar depression, presented to urgent care since 10 AM today (6 hrs ago). EKG showed diffuse ST elevation. He was thus taken emergently to cath lab.   Coronary angiography did not show any culprit artery stenosis. Mild nonobstructive CAD seen. Echocardiogram was essentially normal, details below. He was started on treatment for pericarditis with Aspirin 650 mg-reduced to bid on discharge, PPI pantoprazole, colchicine 0.6 mg bid.   I have requested him to follow up with cardiologist and PCP.   Discharge Exam: Blood pressure (!) 95/57, pulse 62, temperature 97.6 F (36.4 C), temperature source Oral, resp. rate 16, height 6' (1.829 m), weight 77.6 kg, SpO2 97 %.   Constitutional: He is oriented to person, place, and time. He appears well-developed and well-nourished. No distress.  HENT:  Head: Normocephalic and atraumatic.  Eyes: Pupils are equal, round, and reactive to light. Conjunctivae are normal.  Neck: No JVD present.  Cardiovascular: Normal rate, regular rhythm and intact distal pulses.  No murmur heard. Pulmonary/Chest: Effort normal and breath sounds normal. He has no wheezes. He has no rales.  Abdominal: Soft. Bowel sounds are normal. There is no rebound.  Musculoskeletal:        General: No edema.  Lymphadenopathy:    He has no cervical adenopathy.  Neurological: He is alert and oriented to person, place, and time. No cranial nerve deficit.  Skin: Skin is warm and dry.  Psychiatric: He has a normal mood and affect.  Nursing note and vitals  reviewed.   Recommendations on discharge:  Follow up with Cardiologist and PCP   Significant Diagnostic Studies:  EKG 09/28/2019: Ectopic atrial rhythm with diffuse ST elevation  Echocardiogram 09/29/2019:  1. Left ventricular ejection fraction, by visual estimation, is 60 to 65%. The left ventricle has normal function. Normal left ventricular size. There is no left ventricular hypertrophy.  2. Global right ventricle has normal systolic function.The right ventricular size is normal. No increase in right ventricular wall thickness.  3. No significant valvular abnormalities.  4. No pericardial effusion.   Coronary angiography 09/28/2019: LM: None LAD: Mid 30% disease LCx: Normal RCA: Mid 30% disease  Normal LVEF Normal LVEDP  No culprit stenosis found. Suspect pericarditis as the etiology for chest pain and ST elevation.  Labs:   Lab Results  Component Value Date   WBC 10.7 (H) 09/28/2019   HGB 15.3 09/28/2019   HCT 45.3 09/28/2019   MCV 95.4 09/28/2019   PLT 313 09/28/2019    Recent Labs  Lab 09/28/19 1519 09/28/19 1915  NA 139  --   K 3.8  --   CL 105  --   CO2 23  --   BUN 7  --   CREATININE 0.87 0.93  CALCIUM 9.7  --   GLUCOSE 82  --     Lipid Panel     Component Value Date/Time   CHOL 142 09/28/2019 1519   CHOL 186 02/26/2018 0921   TRIG 50 09/28/2019 1519   HDL 42 09/28/2019 1519   HDL 46 02/26/2018 0921   CHOLHDL 3.4 09/28/2019 1519   VLDL 10 09/28/2019 1519   LDLCALC 90 09/28/2019 1519  LDLCALC 106 (H) 02/26/2018 0921     HEMOGLOBIN A1C Lab Results  Component Value Date   HGBA1C 6.0 02/26/2018     Radiology: Dg Chest Port 1 View  Result Date: 09/28/2019 CLINICAL DATA:  Left chest pain. EXAM: PORTABLE CHEST 1 VIEW COMPARISON:  None. FINDINGS: Both lungs are clear. Negative for a pneumothorax. Heart and mediastinum are within normal limits. Trachea is midline. Bone structures are intact. IMPRESSION: No active disease.  Electronically Signed   By: Richarda Overlie M.D.   On: 09/28/2019 15:43   Results for Nathan, Mccullough (MRN [id number]) as of 09/29/2019 03:08  Ref. Range 09/28/2019 15:19 09/28/2019 19:15  Troponin I (High Sensitivity) Latest Ref Range: <18 ng/L <2 17     FOLLOW UP PLANS AND APPOINTMENTS Discharge Instructions    Diet - low sodium heart healthy   Complete by: As directed    Increase activity slowly   Complete by: As directed      Allergies as of 09/29/2019      Reactions   Bee Pollen Anaphylaxis, Other (See Comments), Cough   Nasal congestion, also   Onion Shortness Of Breath, Itching, Other (See Comments)   Throat gets scratchy, also   Other Shortness Of Breath, Itching, Other (See Comments)   Green peppers = Throat gets scratchy, also   Penicillins Anaphylaxis, Itching   Did it involve swelling of the face/tongue/throat, SOB, or low BP? Yes Did it involve sudden or severe rash/hives, skin peeling, or any reaction on the inside of your mouth or nose? Yes Did you need to seek medical attention at a hospital or doctor's office? Yes When did it last happen? "I was 43 years old" If all above answers are "NO", may proceed with cephalosporin use.   Pollen Extract Anaphylaxis, Shortness Of Breath, Other (See Comments), Cough   Head pain, also   Insulin Other (See Comments)   Hyperglycemia, Humulin R (BGL goes UP, not down) tolerates Novolog 70/30   Lovastatin Swelling   Abdominal swelling      Medication List    TAKE these medications   albuterol 108 (90 Base) MCG/ACT inhaler Commonly known as: VENTOLIN HFA Inhale 2 puffs into the lungs every 6 (six) hours as needed for wheezing or shortness of breath.   aspirin 325 MG tablet Take 2 tablets (650 mg total) by mouth 2 (two) times daily.   BC HEADACHE POWDER PO Take 1 packet by mouth as needed (for headaches or mild pain).   colchicine 0.6 MG tablet Take 1 tablet (0.6 mg total) by mouth 2 (two) times daily.   fluticasone 50 MCG/ACT  nasal spray Commonly known as: FLONASE 1 spray by Both Nostrils route daily. What changed:   how much to take  how to take this  when to take this  additional instructions   FreeStyle Libre 14 Day Sensor Misc Inject 1 patch into the skin every 14 (fourteen) days.   HAIR SKIN & NAILS GUMMIES PO Take 1 tablet by mouth daily with breakfast.   insulin lispro 100 UNIT/ML KwikPen Commonly known as: HUMALOG Inject 7-10 Units into the skin 3 (three) times daily before meals. Uses ICR 1:5 on days he does not work; usually takes 7-8 units/day when working; takes correction 1 unit/50 if BG >150   lisinopril 5 MG tablet Commonly known as: ZESTRIL Take 1 tablet (5 mg total) by mouth daily. What changed: when to take this   metoCLOPramide 5 MG tablet Commonly known as: REGLAN Take 5 mg by  mouth 3 (three) times daily before meals.   multivitamin Tabs tablet Take 1 tablet by mouth at bedtime.   One-A-Day Mens Pro Edge Tabs Take by mouth.   pantoprazole 40 MG tablet Commonly known as: PROTONIX Take 1 tablet (40 mg total) by mouth daily. What changed: when to take this   QUEtiapine 200 MG 24 hr tablet Commonly known as: SEROQUEL XR Take 1 tablet (200 mg total) by mouth at bedtime.   QUEtiapine 50 MG tablet Commonly known as: SEROQUEL Take 1 tablet (50 mg total) by mouth daily.   rosuvastatin 20 MG tablet Commonly known as: CRESTOR Take 20 mg by mouth at bedtime.   Toujeo SoloStar 300 UNIT/ML Sopn Generic drug: Insulin Glargine (1 Unit Dial) Inject 40 Units into the skin daily at 8 pm.         Vernell Leep MD, [facility] Cardiovascular Pager: [phone] Office: [phone] If no answer: [phone]

## 2019-09-29 NOTE — Progress Notes (Signed)
Inpatient Diabetes Program Recommendations  AACE/ADA: New Consensus Statement on Inpatient Glycemic Control (2015)  Target Ranges:  Prepandial:   less than 140 mg/dL      Peak postprandial:   less than 180 mg/dL (1-2 hours)      Critically ill patients:  140 - 180 mg/dL   Lab Results  Component Value Date   GLUCAP 88 09/28/2019   HGBA1C 6.0 02/26/2018    Review of Glycemic Control  Diabetes history: Type 1 Outpatient Diabetes medications: Toujeo 40 U at 8pm daily, Humalog sliding scale 7-11 units every hour, PRN if blood sugar over 150 mg/dl. Current orders for Inpatient glycemic control: same as home  Inpatient Diabetes Program Recommendations:   Spoke with staff RN about charting CBGs taken by patient with own CGM and to chart amount of insulin patient is giving himself per doctor's order.   Harvel Ricks RN BSN CDE Diabetes Coordinator Pager: [phone]  8am-5pm

## 2019-09-29 NOTE — Progress Notes (Signed)
2D Echocardiogram has been completed.  Apollos Tenbrink, RCS 

## 2019-09-29 NOTE — Progress Notes (Signed)
TR BAND REMOVAL  LOCATION:    right radial  DEFLATED PER PROTOCOL:    Yes.    TIME BAND OFF / DRESSING APPLIED:    2000   SITE UPON ARRIVAL:    Level 0  SITE AFTER BAND REMOVAL:    Level 0  CIRCULATION SENSATION AND MOVEMENT:    Within Normal Limits   Yes.    COMMENTS:   Post TR band instructions given, applied sterile dressing, good capillary refill.

## 2019-10-03 LAB — GLUCOSE, CAPILLARY: Glucose-Capillary: 42 mg/dL — CL (ref 70–99)

## 2021-07-04 ENCOUNTER — Other Ambulatory Visit: Payer: Self-pay

## 2021-07-04 ENCOUNTER — Ambulatory Visit
Admission: RE | Admit: 2021-07-04 | Discharge: 2021-07-04 | Disposition: A | Discharge status: Home / Self Care | Payer: BLUE CROSS/BLUE SHIELD | Source: Ambulatory Visit | Attending: Family Medicine | Admitting: Family Medicine

## 2021-07-04 ENCOUNTER — Other Ambulatory Visit: Payer: Self-pay | Admitting: Family Medicine

## 2021-07-04 DIAGNOSIS — R413 Other amnesia: Secondary | ICD-10-CM

## 2021-07-04 DIAGNOSIS — R200 Anesthesia of skin: Secondary | ICD-10-CM | POA: Diagnosis present

## 2021-07-04 DIAGNOSIS — G459 Transient cerebral ischemic attack, unspecified: Secondary | ICD-10-CM

## 2021-07-04 DIAGNOSIS — R202 Paresthesia of skin: Secondary | ICD-10-CM | POA: Insufficient documentation

## 2021-10-09 ENCOUNTER — Other Ambulatory Visit: Payer: Self-pay | Admitting: Neurology

## 2021-10-09 DIAGNOSIS — Z8673 Personal history of transient ischemic attack (TIA), and cerebral infarction without residual deficits: Secondary | ICD-10-CM

## 2021-10-30 ENCOUNTER — Other Ambulatory Visit: Payer: Self-pay

## 2021-10-30 ENCOUNTER — Ambulatory Visit
Admission: RE | Admit: 2021-10-30 | Discharge: 2021-10-30 | Disposition: A | Discharge status: Home / Self Care | Payer: BLUE CROSS/BLUE SHIELD | Source: Ambulatory Visit | Attending: Neurology | Admitting: Neurology

## 2021-10-30 DIAGNOSIS — Z8673 Personal history of transient ischemic attack (TIA), and cerebral infarction without residual deficits: Secondary | ICD-10-CM

## 2022-06-29 ENCOUNTER — Ambulatory Visit (HOSPITAL_COMMUNITY)
Admission: EM | Admit: 2022-06-29 | Discharge: 2022-06-29 | Disposition: A | Discharge status: Home / Self Care | Payer: BLUE CROSS/BLUE SHIELD | Attending: Student | Admitting: Student

## 2022-06-29 ENCOUNTER — Encounter (HOSPITAL_COMMUNITY): Payer: Self-pay

## 2022-06-29 DIAGNOSIS — M546 Pain in thoracic spine: Secondary | ICD-10-CM | POA: Diagnosis not present

## 2022-06-29 MED ORDER — TIZANIDINE HCL 2 MG PO TABS: 2.0000 mg | ORAL_TABLET | Freq: Three times a day (TID) | ORAL | 0 refills | Status: AC | PRN

## 2022-06-29 NOTE — Discharge Instructions (Addendum)
-  Start the muscle relaxer-Zanaflex (tizanidine), up to 3 times daily for muscle spasms and pain.  This can make you drowsy, so take at bedtime or when you do not need to drive or operate machinery. Stop BC powder.  -Tylenol for pain -Heating pad -Seek additional care if new symptoms - severe back pain, pain radiating down legs, dizziness, severe headaches, etc.
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
## 2022-09-25 ENCOUNTER — Other Ambulatory Visit: Payer: Self-pay | Admitting: Student

## 2022-09-25 DIAGNOSIS — M545 Low back pain, unspecified: Secondary | ICD-10-CM

## 2022-09-25 DIAGNOSIS — F319 Bipolar disorder, unspecified: Secondary | ICD-10-CM

## 2022-10-02 ENCOUNTER — Other Ambulatory Visit: Payer: Self-pay | Admitting: Student

## 2022-10-02 ENCOUNTER — Ambulatory Visit
Admission: RE | Admit: 2022-10-02 | Discharge: 2022-10-02 | Disposition: A | Discharge status: Home / Self Care | Payer: BLUE CROSS/BLUE SHIELD | Source: Ambulatory Visit | Attending: Student | Admitting: Student

## 2022-10-02 DIAGNOSIS — F319 Bipolar disorder, unspecified: Secondary | ICD-10-CM | POA: Diagnosis present

## 2022-10-02 DIAGNOSIS — M545 Low back pain, unspecified: Secondary | ICD-10-CM

## 2022-10-02 MED ORDER — GADOBUTROL 1 MMOL/ML IV SOLN: 7.5000 mL | Freq: Once | INTRAVENOUS | Status: DC | PRN

## 2023-05-27 ENCOUNTER — Encounter (INDEPENDENT_AMBULATORY_CARE_PROVIDER_SITE_OTHER): Payer: Self-pay | Admitting: Primary Care

## 2023-05-27 ENCOUNTER — Other Ambulatory Visit: Payer: Self-pay | Admitting: Pharmacist

## 2023-05-27 ENCOUNTER — Ambulatory Visit (INDEPENDENT_AMBULATORY_CARE_PROVIDER_SITE_OTHER): Payer: Self-pay | Admitting: Primary Care

## 2023-05-27 VITALS — BP 129/77 | HR 73 | Resp 16 | Ht 72.0 in | Wt 163.8 lb

## 2023-05-27 DIAGNOSIS — Z23 Encounter for immunization: Secondary | ICD-10-CM

## 2023-05-27 DIAGNOSIS — E1065 Type 1 diabetes mellitus with hyperglycemia: Secondary | ICD-10-CM

## 2023-05-27 DIAGNOSIS — E785 Hyperlipidemia, unspecified: Secondary | ICD-10-CM

## 2023-05-27 DIAGNOSIS — I152 Hypertension secondary to endocrine disorders: Secondary | ICD-10-CM

## 2023-05-27 DIAGNOSIS — R351 Nocturia: Secondary | ICD-10-CM

## 2023-05-27 DIAGNOSIS — E1159 Type 2 diabetes mellitus with other circulatory complications: Secondary | ICD-10-CM

## 2023-05-27 DIAGNOSIS — M25511 Pain in right shoulder: Secondary | ICD-10-CM

## 2023-05-27 DIAGNOSIS — Z1159 Encounter for screening for other viral diseases: Secondary | ICD-10-CM

## 2023-05-27 DIAGNOSIS — Z1211 Encounter for screening for malignant neoplasm of colon: Secondary | ICD-10-CM

## 2023-05-27 DIAGNOSIS — E10649 Type 1 diabetes mellitus with hypoglycemia without coma: Secondary | ICD-10-CM

## 2023-05-27 DIAGNOSIS — J4540 Moderate persistent asthma, uncomplicated: Secondary | ICD-10-CM

## 2023-05-27 LAB — POCT GLYCOSYLATED HEMOGLOBIN (HGB A1C): HbA1c, POC (controlled diabetic range): 6.5 % (ref 0.0–7.0)

## 2023-05-27 LAB — GLUCOSE, POCT (MANUAL RESULT ENTRY): POC Glucose: 103 mg/dl — AB (ref 70–99)

## 2023-05-27 MED ORDER — INSULIN LISPRO 100 UNIT/ML IJ SOLN: INTRAMUSCULAR | 6 refills | Status: AC

## 2023-05-27 MED ORDER — LISINOPRIL 5 MG PO TABS: 5.0000 mg | ORAL_TABLET | Freq: Every day | ORAL | 1 refills | Status: AC

## 2023-05-27 MED ORDER — ALBUTEROL SULFATE HFA 108 (90 BASE) MCG/ACT IN AERS
2.0000 | INHALATION_SPRAY | Freq: Four times a day (QID) | RESPIRATORY_TRACT | 1 refills | Status: AC | PRN
Start: 2023-05-27 — End: ?

## 2023-05-27 MED ORDER — DEXCOM G6 SENSOR MISC: 6 refills | Status: AC

## 2023-05-27 MED ORDER — ROSUVASTATIN CALCIUM 20 MG PO TABS
20.0000 mg | ORAL_TABLET | Freq: Every day | ORAL | 1 refills | Status: AC
Start: 2023-05-27 — End: ?

## 2023-05-27 NOTE — Progress Notes (Signed)
New Patient Office Visit  Subjective    Patient ID: Nathan Mccullough, male    DOB: April 03, 1976  Age: 47 y.o. MRN: [id number]  CC:  Chief Complaint  Patient presents with   Shoulder Pain    Right    New Patient (Initial Visit)    HPI Nathan Mccullough is a 47 year old male that presents to establish care. He has a hx of type 1 dm and would like medication refill. Patient is managed with a insulin pump and will be referred to clinical pharmacist. Denies any dm signs or numbness. Patient voices concerns about right shoulder pain. Remembers a pop when his dog pulled him on the lease. Rates pain 8 out 10. This has been approx 3 months ago. On examination crepitus. Patient has No headache, No chest pain, No abdominal pain - No Nausea, No new weakness tingling or numbness, No Cough - shortness of breath     Outpatient Encounter Medications as of 05/27/2023  Medication Sig   albuterol (PROVENTIL HFA;VENTOLIN HFA) 108 (90 Base) MCG/ACT inhaler Inhale 2 puffs into the lungs every 6 (six) hours as needed for wheezing or shortness of breath.   aspirin 325 MG tablet Take 2 tablets (650 mg total) by mouth 2 (two) times daily.   Biotin w/ Vitamins C & E (HAIR SKIN & NAILS GUMMIES PO) Take 1 tablet by mouth daily with breakfast.   colchicine 0.6 MG tablet Take 1 tablet (0.6 mg total) by mouth 2 (two) times daily.   Continuous Blood Gluc Sensor (FREESTYLE LIBRE 14 DAY SENSOR) MISC Inject 1 patch into the skin every 14 (fourteen) days.    fluticasone (FLONASE) 50 MCG/ACT nasal spray 1 spray by Both Nostrils route daily. (Patient taking differently: Place 2 sprays into both nostrils daily. )   insulin lispro (HUMALOG) 100 UNIT/ML KwikPen Inject 7-10 Units into the skin 3 (three) times daily before meals. Uses ICR 1:5 on days he does not work; usually takes 7-8 units/day when working; takes correction 1 unit/50 if BG >150   lisinopril (PRINIVIL,ZESTRIL) 5 MG tablet Take 1 tablet (5 mg total) by mouth daily. (Patient  taking differently: Take 5 mg by mouth at bedtime. )   metoCLOPramide (REGLAN) 5 MG tablet Take 5 mg by mouth 3 (three) times daily before meals.   Multiple Vitamins-Minerals (ONE-A-DAY MENS PRO EDGE) TABS Take by mouth.   multivitamin (ONE-A-DAY MEN'S) TABS tablet Take 1 tablet by mouth at bedtime.   pantoprazole (PROTONIX) 40 MG tablet Take 1 tablet (40 mg total) by mouth daily.   QUEtiapine (SEROQUEL XR) 200 MG 24 hr tablet Take 1 tablet (200 mg total) by mouth at bedtime.   QUEtiapine (SEROQUEL) 50 MG tablet Take 1 tablet (50 mg total) by mouth daily.   rosuvastatin (CRESTOR) 20 MG tablet Take 20 mg by mouth at bedtime.   tiZANidine (ZANAFLEX) 2 MG tablet Take 1 tablet (2 mg total) by mouth every 8 (eight) hours as needed for muscle spasms.   TOUJEO SOLOSTAR 300 UNIT/ML SOPN Inject 40 Units into the skin daily at 8 pm.    No facility-administered encounter medications on file as of 05/27/2023.    Past Medical History:  Diagnosis Date   Allergy    Asthma    Bipolar depression (HCC)    CHF (congestive heart failure) (HCC) 2017   Hyperlipidemia    IDDM (insulin dependent diabetes mellitus)    since age 68yo   Smoker     Past Surgical History:  Procedure  Laterality Date   COLONOSCOPY  2017   normal, repeat 2021.  Dr. Elby Showers, [place], [place]   LEFT HEART CATH AND CORONARY ANGIOGRAPHY N/A 09/28/2019   Procedure: LEFT HEART CATH AND CORONARY ANGIOGRAPHY;  Surgeon: Elder Negus, MD;  Location: [facility];  Service: Cardiovascular;  Laterality: N/A;   NO PAST SURGERIES  02/2018    Family History  Problem Relation Age of Onset   Cancer Father 65       colon   Asthma Brother    COPD Maternal Grandfather    Cancer Paternal Grandfather        lung   COPD Paternal Grandfather     Social History   Socioeconomic History   Marital status: Single    Spouse name: Not on file   Number of children: Not on file   Years of education: Not on file   Highest education  level: Not on file  Occupational History   Not on file  Tobacco Use   Smoking status: Every Day    Packs/day: .1    Types: Cigarettes   Smokeless tobacco: Never  Vaping Use   Vaping Use: Never used  Substance and Sexual Activity   Alcohol use: No   Drug use: No   Sexual activity: Not on file  Other Topics Concern   Not on file  Social History Narrative   Works at [employer].   Walks 5+ miles daily at the car lot.  02/2018   Social Determinants of Health   Financial Resource Strain: Not on file  Food Insecurity: Not on file  Transportation Needs: Not on file  Physical Activity: Not on file  Stress: Not on file  Social Connections: Not on file  Intimate Partner Violence: Not on file    ROS Comprehensive ROS Pertinent positive and negative noted in HPI       Objective   Blood Pressure 129/77   Pulse 73   Respiration 16   Height 6' (1.829 m)   Weight 163 lb 12.8 oz (74.3 kg)   Oxygen Saturation 99%   Body Mass Index 22.22 kg/m    Physical Exam Vitals reviewed.  Constitutional:      Appearance: Normal appearance. He is normal weight.  HENT:     Head: Normocephalic.     Right Ear: External ear normal.     Left Ear: External ear normal.     Ears:     Comments: B/l cerumen impact     Nose: Nose normal.  Eyes:     Extraocular Movements: Extraocular movements intact.     Pupils: Pupils are equal, round, and reactive to light.     Comments: Right eye unable to obtain perrla-pt states blind in that eye  Cardiovascular:     Rate and Rhythm: Normal rate and regular rhythm.  Pulmonary:     Breath sounds: Normal breath sounds.  Abdominal:     General: Abdomen is flat. Bowel sounds are normal.  Musculoskeletal:     Comments: Right shoulder stiffness and pain, decrease rom and crepitus  Skin:    General: Skin is warm and dry.  Neurological:     Mental Status: He is alert and oriented to person, place, and time.  Psychiatric:        Mood and Affect: Mood normal.         Behavior: Behavior normal.     Assessment & Plan:  Nathan Mccullough was seen today for shoulder pain, new patient (initial  visit) and diabetes.  Diagnoses and all orders for this visit:  Type 1 diabetes mellitus with hypoglycemia and without coma (HCC) - educated on lifestyle modifications, including but not limited to diet choices and adding exercise to daily routine.   -     POCT glucose (manual entry) -     POCT glycosylated hemoglobin (Hb A1C) -     Microalbumin / creatinine urine ratio -     CBC with Differential/Platelet -     Ambulatory referral to Podiatry  Acute pain of right shoulder See hpi -     Ambulatory referral to Orthopedic Surgery  Hyperlipidemia, unspecified hyperlipidemia type -     Lipid panel  Moderate persistent asthma, unspecified whether complicated  Hypertension associated with diabetes (HCC) -     CMP14+EGFR  Encounter for HCV screening test for low risk patient -     HCV Ab w Reflex to Quant PCR  Colon cancer screening -     Fecal occult blood, imunochemical  Nocturia -     PSA  Other orders -     Tdap vaccine greater than or equal to 7yo IM -     lisinopril (ZESTRIL) 5 MG tablet; Take 1 tablet (5 mg total) by mouth at bedtime. -     rosuvastatin (CRESTOR) 20 MG tablet; Take 1 tablet (20 mg total) by mouth at bedtime. -     albuterol (VENTOLIN HFA) 108 (90 Base) MCG/ACT inhaler; Inhale 2 puffs into the lungs every 6 (six) hours as needed for wheezing or shortness of breath.   Grayce Sessions, NP

## 2023-05-28 LAB — MICROALBUMIN / CREATININE URINE RATIO
Creatinine, Urine: 108.7 mg/dL
Microalb/Creat Ratio: 5 mg/g creat (ref 0–29)
Microalbumin, Urine: 5.2 ug/mL

## 2023-05-28 LAB — CBC WITH DIFFERENTIAL/PLATELET

## 2023-05-28 LAB — LIPID PANEL

## 2023-05-28 LAB — PSA

## 2023-05-28 LAB — CMP14+EGFR

## 2023-05-28 LAB — HCV AB W REFLEX TO QUANT PCR

## 2023-05-30 LAB — FECAL OCCULT BLOOD, IMMUNOCHEMICAL: Fecal Occult Bld: NEGATIVE

## 2023-06-02 ENCOUNTER — Other Ambulatory Visit (INDEPENDENT_AMBULATORY_CARE_PROVIDER_SITE_OTHER): Payer: Self-pay

## 2023-06-02 ENCOUNTER — Ambulatory Visit (INDEPENDENT_AMBULATORY_CARE_PROVIDER_SITE_OTHER): Payer: Self-pay | Admitting: Orthopaedic Surgery

## 2023-06-02 DIAGNOSIS — G8929 Other chronic pain: Secondary | ICD-10-CM

## 2023-06-02 DIAGNOSIS — M25511 Pain in right shoulder: Secondary | ICD-10-CM

## 2023-06-02 MED ORDER — BUPIVACAINE HCL 0.5 % IJ SOLN
3.0000 mL | INTRAMUSCULAR | Status: AC | PRN
  Administered 2023-06-02: 3 mL via INTRA_ARTICULAR

## 2023-06-02 MED ORDER — METHYLPREDNISOLONE ACETATE 40 MG/ML IJ SUSP
40.0000 mg | INTRAMUSCULAR | Status: AC | PRN
Start: 2023-06-02 — End: 2023-06-02
  Administered 2023-06-02: 40 mg via INTRA_ARTICULAR

## 2023-06-02 MED ORDER — LIDOCAINE HCL 1 % IJ SOLN
3.0000 mL | INTRAMUSCULAR | Status: AC | PRN
  Administered 2023-06-02: 3 mL

## 2023-06-02 NOTE — Progress Notes (Signed)
Office Visit Note   Patient: Nathan Mccullough           Date of Birth: 1976/10/11           MRN: [id number] Visit Date: 06/02/2023              Requested by: Grayce Sessions, NP [id number] [place],  [place] [address] PCP: Marguarite Arbour, MD   Assessment & Plan: Visit Diagnoses:  1. Chronic right shoulder pain     Plan: Impressions 47 year old gentleman with chronic right shoulder pain status post injury from walking his dog.  Etiology is unclear.  Based on treatment options he would like to try subacromial injection today.  He tolerated this well.  Will see him back if symptoms do not improve.  Follow-Up Instructions: No follow-ups on file.   Orders:  Orders Placed This Encounter  Procedures   Large Joint Inj: R subacromial bursa   XR Shoulder Right   No orders of the defined types were placed in this encounter.     Procedures: Large Joint Inj: R subacromial bursa on 06/02/2023 10:24 AM Indications: pain Details: 22 G needle  Arthrogram: No  Medications: 3 mL lidocaine 1 %; 3 mL bupivacaine 0.5 %; 40 mg methylPREDNISolone acetate 40 MG/ML Outcome: tolerated well, no immediate complications Consent was given by the patient. Patient was prepped and draped in the usual sterile fashion.       Clinical Data: No additional findings.   Subjective: Chief Complaint  Patient presents with   Right Shoulder - Pain    HPI Patient is a 47 year old gentleman comes in for evaluation of right shoulder pain for about 2 to 3 months.  He was walking a dog and dog pulled his arm backwards.  Since then he has had lateral deltoid shoulder pain.  Denies any radicular pain.  He has pain with shoulder abduction past 90 degrees. Review of Systems  Constitutional: Negative.   HENT: Negative.    Eyes: Negative.   Respiratory: Negative.    Cardiovascular: Negative.   Gastrointestinal: Negative.   Endocrine: Negative.   Genitourinary: Negative.   Skin: Negative.    Allergic/Immunologic: Negative.   Neurological: Negative.   Hematological: Negative.   Psychiatric/Behavioral: Negative.    All other systems reviewed and are negative.    Objective: Vital Signs: There were no vitals taken for this visit.  Physical Exam Vitals and nursing note reviewed.  Constitutional:      Appearance: He is well-developed.  HENT:     Head: Normocephalic and atraumatic.  Eyes:     Pupils: Pupils are equal, round, and reactive to light.  Pulmonary:     Effort: Pulmonary effort is normal.  Abdominal:     Palpations: Abdomen is soft.  Musculoskeletal:        General: Normal range of motion.     Cervical back: Neck supple.  Skin:    General: Skin is warm.  Neurological:     Mental Status: He is alert and oriented to person, place, and time.  Psychiatric:        Behavior: Behavior normal.        Thought Content: Thought content normal.        Judgment: Judgment normal.     Ortho Exam Examination of the right shoulder shows full active and passive range of motion with mild pain.  Pain with impingement test and pain with manual muscle testing of the rotator cuff.  The strength of  the rotator cuff is normal.  Equivocal Speed and O'Brien's sign. Specialty Comments:  No specialty comments available.  Imaging: XR Shoulder Right  Result Date: 06/02/2023 3 view xrays show no acute or structural abnormalities    PMFS History: Patient Active Problem List   Diagnosis Date Noted   Pericarditis 09/28/2019   Acute pericarditis 09/28/2019   Abnormal echocardiogram 04/22/2018   Vaccine counseling 04/22/2018   Abnormal EKG 04/22/2018   Hyperlipidemia due to type 1 diabetes mellitus (HCC) 03/25/2018   Hypertension associated with diabetes (HCC) 03/25/2018   Type 1 diabetes mellitus with hypoglycemia and without coma (HCC) 03/25/2018   Uncontrolled type 1 diabetes mellitus with hyperglycemia (HCC) 02/26/2018   Hyperlipidemia 02/26/2018   Smoker 02/26/2018    Moderate persistent asthma 02/26/2018   Bipolar affective disorder (HCC) 02/26/2018   Allergic rhinitis due to pollen 02/26/2018   Past Medical History:  Diagnosis Date   Allergy    Asthma    Bipolar depression (HCC)    CHF (congestive heart failure) (HCC) 2017   Hyperlipidemia    IDDM (insulin dependent diabetes mellitus)    since age 11yo   Smoker     Family History  Problem Relation Age of Onset   Cancer Father 34       colon   Asthma Brother    COPD Maternal Grandfather    Cancer Paternal Grandfather        lung   COPD Paternal Grandfather     Past Surgical History:  Procedure Laterality Date   COLONOSCOPY  2017   normal, repeat 2021.  Dr. Elby Showers, [place], [place]   LEFT HEART CATH AND CORONARY ANGIOGRAPHY N/A 09/28/2019   Procedure: LEFT HEART CATH AND CORONARY ANGIOGRAPHY;  Surgeon: Elder Negus, MD;  Location: [facility];  Service: Cardiovascular;  Laterality: N/A;   NO PAST SURGERIES  02/2018   Social History   Occupational History   Not on file  Tobacco Use   Smoking status: Every Day    Packs/day: .1    Types: Cigarettes   Smokeless tobacco: Never  Vaping Use   Vaping Use: Never used  Substance and Sexual Activity   Alcohol use: No   Drug use: No   Sexual activity: Not on file

## 2023-06-24 ENCOUNTER — Other Ambulatory Visit (INDEPENDENT_AMBULATORY_CARE_PROVIDER_SITE_OTHER): Payer: Self-pay

## 2023-06-24 DIAGNOSIS — E1065 Type 1 diabetes mellitus with hyperglycemia: Secondary | ICD-10-CM

## 2023-07-30 IMAGING — MR MR HEAD W/O CM
10 series · 48 of 48 positions shown · non-contrast
Comparison: Head CT 07/04/2021.

CLINICAL DATA: 45-year-old male.  TIA.

EXAM:
MRI HEAD WITHOUT CONTRAST
TECHNIQUE: Multiplanar, multiecho pulse sequences of the brain and surrounding
structures were obtained without intravenous contrast.

[Series 2: T1 · sagittal · 5.0mm · 0.45mm/px · 2 of 21 slices shown]
[im 1/21]
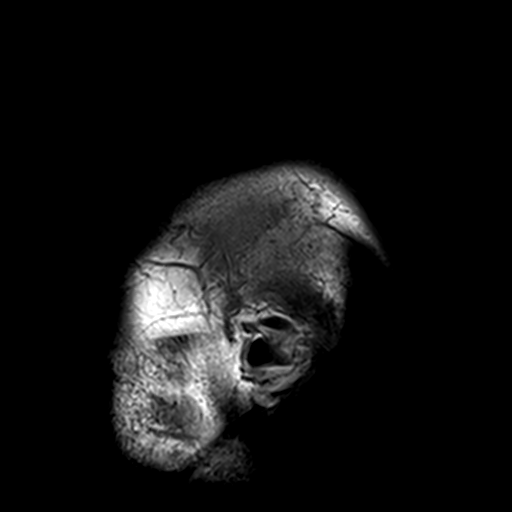
[im 21/21]
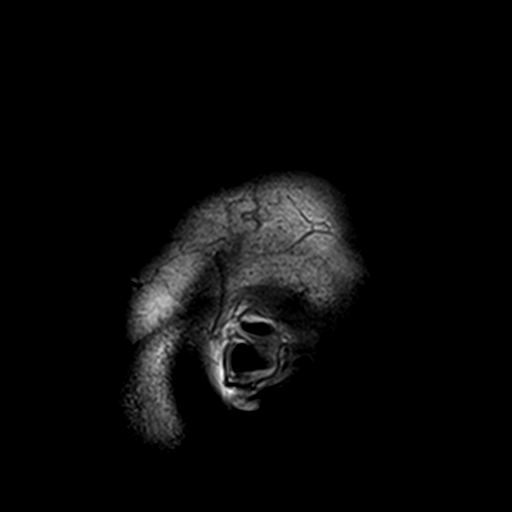

[Series 3: DWI · axial · 3.0mm · 1.80mm/px · z∈[-56,+85]mm · 8 of 96 slices shown (1 of 4)]
[im 1/96]
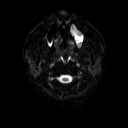
[im 14/96]
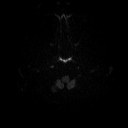
[im 28/96]
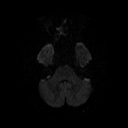
[im 41/96]
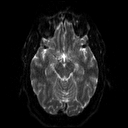
[im 55/96]
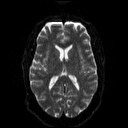
[im 68/96]
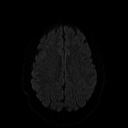
[im 82/96]
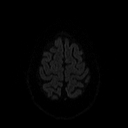
[im 96/96]
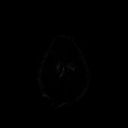

[Series 4: DWI · axial · 3.0mm · 1.80mm/px · z∈[-56,+85]mm · 4 of 48 slices shown (2 of 4)]
[im 1/48]
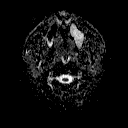
[im 16/48]
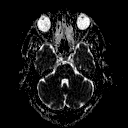
[im 32/48]
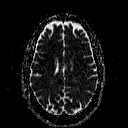
[im 48/48]
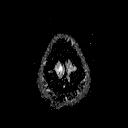

[Series 5: DWI · coronal · 5.0mm · 1.80mm/px · 7 of 78 slices shown (3 of 4)]
[im 1/78]
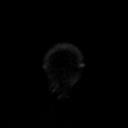
[im 13/78]
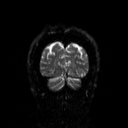
[im 26/78]
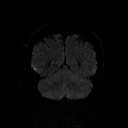
[im 39/78]
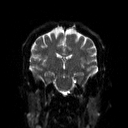
[im 52/78]
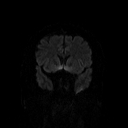
[im 65/78]
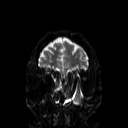
[im 78/78]
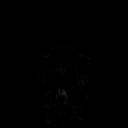

[Series 6: DWI · coronal · 5.0mm · 1.80mm/px · 3 of 39 slices shown (4 of 4)]
[im 1/39]
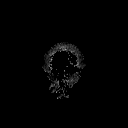
[im 20/39]
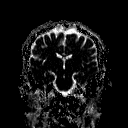
[im 39/39]
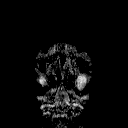

[Series 7: T2 · axial · 5.0mm · 0.51mm/px · z∈[-66,+81]mm · 2 of 22 slices shown (1 of 2)]
[im 1/22]
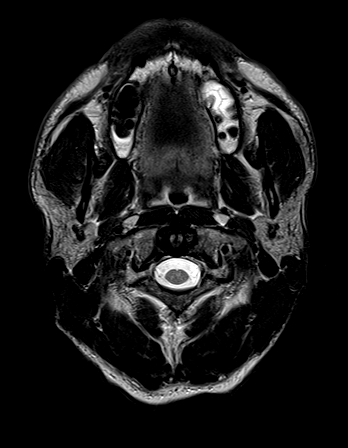
[im 22/22]
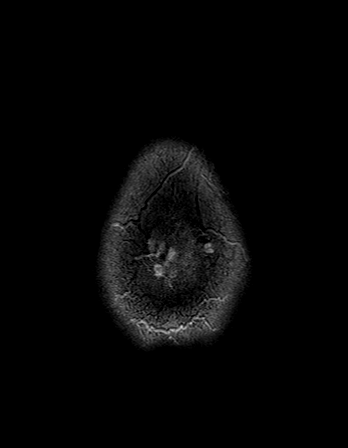

[Series 8: FLAIR · axial · 3.0mm · 0.45mm/px · z∈[-64,+80]mm · 3 of 32 slices shown]
[im 1/32]
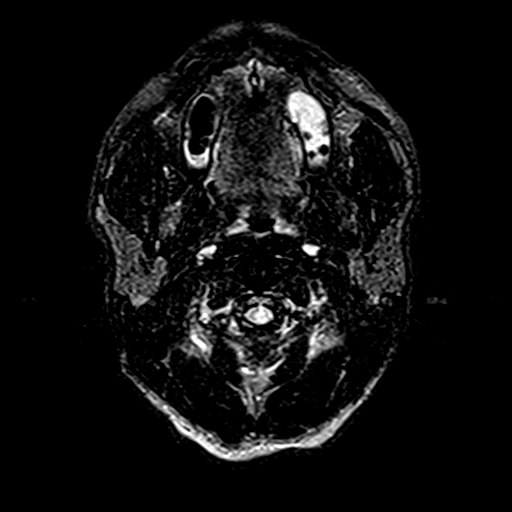
[im 16/32]
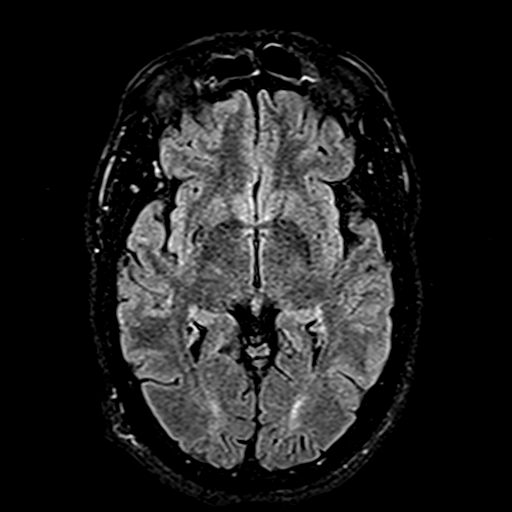
[im 32/32]
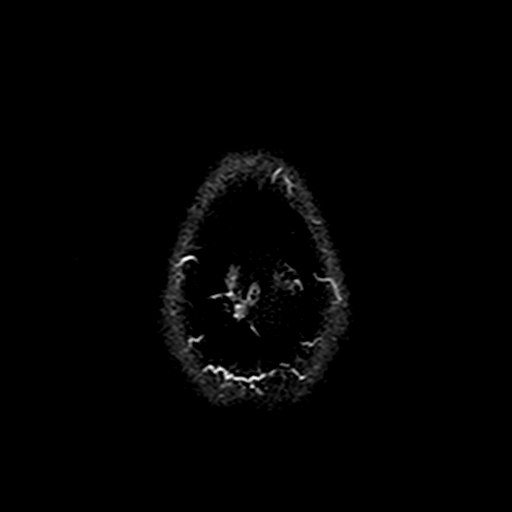

[Series 10: swi_images · axial · 4.0mm · 0.90mm/px · z∈[-62,+78]mm · 3 of 36 slices shown]
[im 1/36]
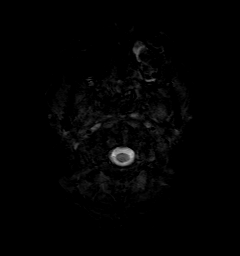
[im 18/36]
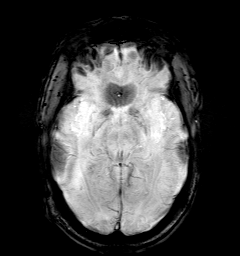
[im 36/36]
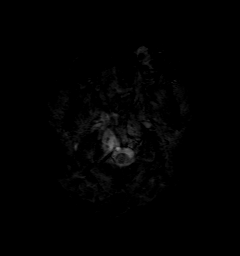

[Series 11: t1_mpr_tra · axial · 1.0mm · 0.71mm/px · z∈[-64,+79]mm · 13 of 144 slices shown]
[im 1/144]
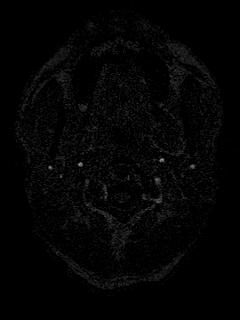
[im 12/144]
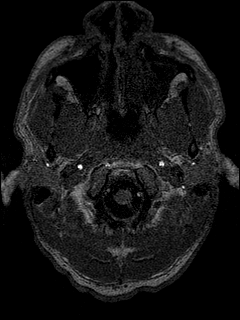
[im 24/144]
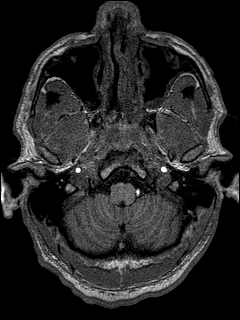
[im 36/144]
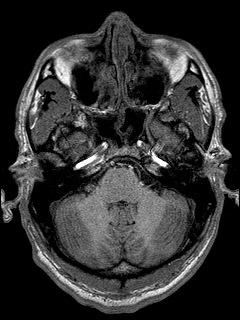
[im 48/144]
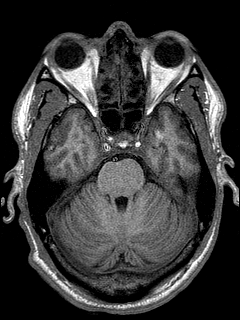
[im 60/144]
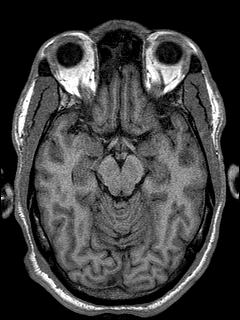
[im 72/144]
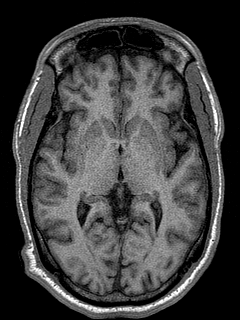
[im 84/144]
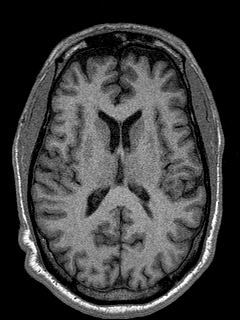
[im 96/144]
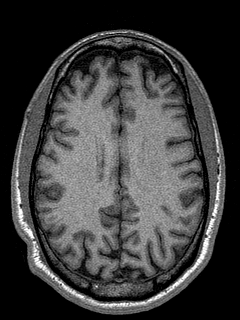
[im 108/144]
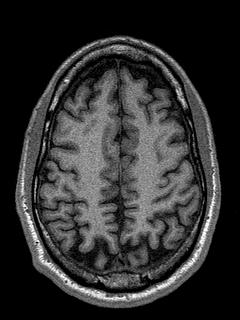
[im 120/144]
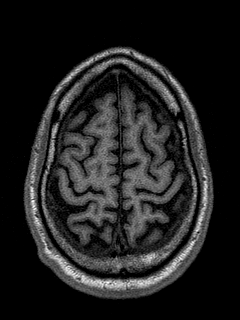
[im 132/144]
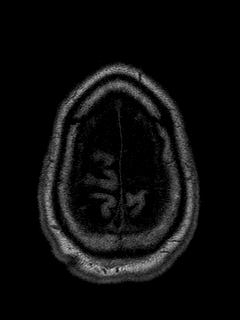
[im 144/144]
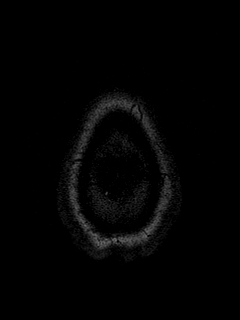

[Series 12: T2 · coronal · 5.0mm · 0.45mm/px · 3 of 29 slices shown (2 of 2)]
[im 1/29]
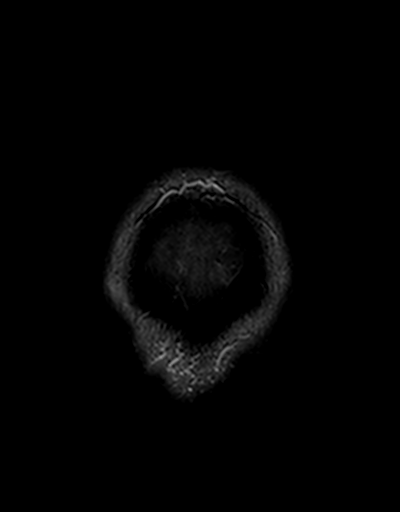
[im 15/29]
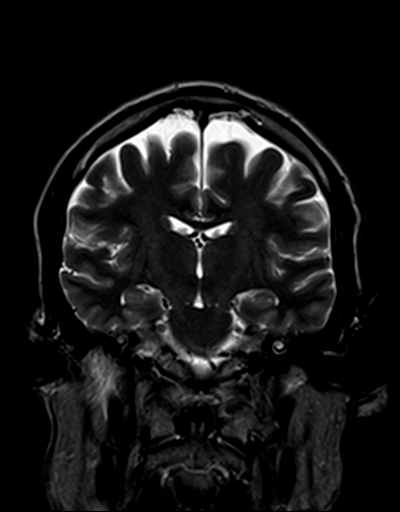
[im 29/29]
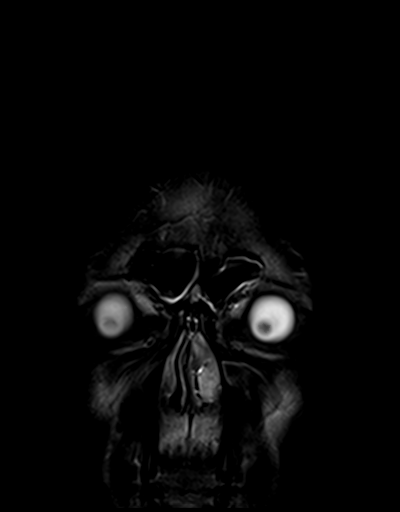

[48 of 48 positions shown; findings below may reference images not displayed]

FINDINGS: Brain: Cerebral volume is within normal limits. No restricted
diffusion to suggest acute infarction. No midline shift, mass
effect, evidence of mass lesion, ventriculomegaly, extra-axial
collection or acute intracranial hemorrhage. Cervicomedullary
junction and pituitary are within normal limits.

Inferior right lentiform perivascular space (normal variant). Gray
and white matter signal is within normal limits throughout the
brain. No cortical encephalomalacia or chronic cerebral blood
products. Basal ganglia, thalami, brainstem and cerebellum are
within normal limits.

Vascular: Major intracranial vascular flow voids are preserved.

Skull and upper cervical spine: Negative visible cervical spine.
Visualized bone marrow signal is within normal limits.

Sinuses/Orbits: Negative orbits. Moderate, widespread bilateral
paranasal sinus mucosal thickening. Small right maxillary sinus
fluid level.

Other: Mastoids are clear. Visible internal auditory structures
appear normal. Negative visible scalp and face.
IMPRESSION: 1. Normal noncontrast MRI appearance of the brain. Incidental right
basal ganglia perivascular space (normal variant).
2. Moderate bilateral paranasal sinus inflammation.

## 2023-09-29 ENCOUNTER — Other Ambulatory Visit (INDEPENDENT_AMBULATORY_CARE_PROVIDER_SITE_OTHER): Payer: Self-pay | Admitting: Primary Care

## 2023-10-21 ENCOUNTER — Other Ambulatory Visit: Payer: Self-pay | Admitting: Surgery

## 2023-10-21 DIAGNOSIS — M25511 Pain in right shoulder: Secondary | ICD-10-CM

## 2023-11-03 ENCOUNTER — Encounter: Payer: Self-pay | Admitting: Surgery

## 2023-11-08 ENCOUNTER — Ambulatory Visit
Admission: RE | Admit: 2023-11-08 | Discharge: 2023-11-08 | Disposition: A | Discharge status: Home / Self Care | Payer: Self-pay | Source: Ambulatory Visit | Attending: Surgery | Admitting: Surgery

## 2023-11-08 DIAGNOSIS — M25511 Pain in right shoulder: Secondary | ICD-10-CM

## 2024-05-25 ENCOUNTER — Other Ambulatory Visit (INDEPENDENT_AMBULATORY_CARE_PROVIDER_SITE_OTHER): Payer: Self-pay | Admitting: Primary Care

## 2024-05-26 NOTE — Telephone Encounter (Signed)
 Requested Prescriptions  Refused Prescriptions Disp Refills   lisinopril  (ZESTRIL ) 5 MG tablet [Pharmacy Med Name: LISINOPRIL  5 MG TABLET] 90 tablet 1    Sig: TAKE 1 TABLET BY MOUTH EVERYDAY AT BEDTIME     Cardiovascular:  ACE Inhibitors Failed - 05/26/2024 12:55 PM      Failed - Cr in normal range and within 180 days    Creatinine, Ser  Date Value Ref Range Status  09/28/2019 0.93 0.61 - 1.24 mg/dL Final         Failed - K in normal range and within 180 days    Potassium  Date Value Ref Range Status  09/28/2019 3.8 3.5 - 5.1 mmol/L Final         Failed - Valid encounter within last 6 months    Recent Outpatient Visits           1 year ago Type 1 diabetes mellitus with hypoglycemia and without coma [facility])   [facility] Marius Siemens, NP              Passed - Patient is not pregnant      Passed - Last BP in normal range    BP Readings from Last 1 Encounters:  05/27/23 129/77
